# Patient Record
Sex: Female | Born: 1990
Health system: Southern US, Community
[De-identification: ages and names within clinical notes are randomized; demographics above are authoritative.]

## PROBLEM LIST (undated history)

## (undated) DIAGNOSIS — I10 Essential (primary) hypertension: Secondary | ICD-10-CM

---

## 2011-05-30 ENCOUNTER — Emergency Department (INDEPENDENT_AMBULATORY_CARE_PROVIDER_SITE_OTHER): Payer: PRIVATE HEALTH INSURANCE

## 2011-05-30 ENCOUNTER — Encounter (HOSPITAL_BASED_OUTPATIENT_CLINIC_OR_DEPARTMENT_OTHER): Payer: Self-pay | Admitting: Emergency Medicine

## 2011-05-30 ENCOUNTER — Emergency Department (HOSPITAL_BASED_OUTPATIENT_CLINIC_OR_DEPARTMENT_OTHER)
Admission: EM | Admit: 2011-05-30 | Discharge: 2011-05-30 | Disposition: A | Discharge status: Home / Self Care | Payer: PRIVATE HEALTH INSURANCE | Attending: Emergency Medicine | Admitting: Emergency Medicine

## 2011-05-30 DIAGNOSIS — R079 Chest pain, unspecified: Secondary | ICD-10-CM

## 2011-05-30 DIAGNOSIS — R0781 Pleurodynia: Secondary | ICD-10-CM

## 2011-05-30 DIAGNOSIS — R05 Cough: Secondary | ICD-10-CM

## 2011-05-30 DIAGNOSIS — R071 Chest pain on breathing: Secondary | ICD-10-CM | POA: Insufficient documentation

## 2011-05-30 MED ORDER — NAPROXEN 500 MG PO TABS: 500.0000 mg | ORAL_TABLET | Freq: Two times a day (BID) | ORAL | Status: AC

## 2011-05-30 NOTE — ED Notes (Signed)
Chest pain started  At 8pm  With deep breathing and cough.  Has had a cough 2 weeks

## 2011-05-30 NOTE — ED Provider Notes (Addendum)
History     CSN: [id number]  Arrival date & time 05/30/11  0240   First MD Initiated Contact with Patient 05/30/11 0303      Chief Complaint  Patient presents with  . Chest Pain    (Consider location/radiation/quality/duration/timing/severity/associated sxs/prior treatment) HPI Comments: 21 year old female with no significant past medical history presents with a complaint of a sharp chest pain that is present in the left chest and of which she only feels when she is taking a deep breath, coughing, laughing. This is mild, intermittent, not associated with fevers chills nausea vomiting abdominal pain back pain sore throat congestion swelling rash headache blurred vision or any other complaints. She has had a cough for the last 3 weeks which has been persistent over time, intermittent throughout the day and dry and nonproductive. She denies  diarrhea or dysuria. She has had no medications prior to arrival.  Patient denies travel, immobilization, surgery, trauma, hormone therapy, smoking. She denies any swelling or asymmetry of her legs or arms.  Patient is a 21 y.o. female presenting with chest pain. The history is provided by the patient and a friend.  Chest Pain     History reviewed. No pertinent past medical history.  History reviewed. No pertinent past surgical history.  No family history on file.  History  Substance Use Topics  . Smoking status: Never Smoker   . Smokeless tobacco: Not on file  . Alcohol Use: No    OB History    Grav Para Term Preterm Abortions TAB SAB Ect Mult Living                  Review of Systems  Cardiovascular: Positive for chest pain.  All other systems reviewed and are negative.    Allergies  Codeine  Home Medications   Current Outpatient Rx  Name Route Sig Dispense Refill  . NAPROXEN 500 MG PO TABS Oral Take 1 tablet (500 mg total) by mouth 2 (two) times daily with a meal. 30 tablet 0    BP 135/88  Pulse 88  Temp(Src) 98.1 F  (36.7 C) (Oral)  Resp 18  Ht 5\' 1"  (1.549 m)  Wt 138 lb (62.596 kg)  BMI 26.07 kg/m2  SpO2 100%  Physical Exam  Nursing note and vitals reviewed. Constitutional: She appears well-developed and well-nourished. No distress.  HENT:  Head: Normocephalic and atraumatic.  Mouth/Throat: Oropharynx is clear and moist. No oropharyngeal exudate.  Eyes: Conjunctivae and EOM are normal. Pupils are equal, round, and reactive to light. Right eye exhibits no discharge. Left eye exhibits no discharge. No scleral icterus.  Neck: Normal range of motion. Neck supple. No JVD present. No thyromegaly present.  Cardiovascular: Normal rate, regular rhythm, normal heart sounds and intact distal pulses.  Exam reveals no gallop and no friction rub.   No murmur heard. Pulmonary/Chest: Effort normal and breath sounds normal. No respiratory distress. She has no wheezes. She has no rales. She exhibits no tenderness.  Abdominal: Soft. Bowel sounds are normal. She exhibits no distension and no mass. There is no tenderness.  Musculoskeletal: Normal range of motion. She exhibits no edema and no tenderness.  Lymphadenopathy:    She has no cervical adenopathy.  Neurological: She is alert. Coordination normal.  Skin: Skin is warm and dry. No rash noted. No erythema.  Psychiatric: She has a normal mood and affect. Her behavior is normal.    ED Course  Procedures (including critical care time)  Labs Reviewed - No data to  display Dg Chest 2 View  05/30/2011  *RADIOLOGY REPORT*  Clinical Data: Lambert Mody left-sided chest pain and cough.  CHEST - 2 VIEW  Comparison: None.  Findings: The lungs are well-aerated and clear.  There is no evidence of focal opacification, pleural effusion or pneumothorax.  The heart is normal in size; the mediastinal contour is within normal limits.  No acute osseous abnormalities are seen.  IMPRESSION: No acute cardiopulmonary process seen.  Original Report Authenticated By: Tonia Ghent, M.D.      1. Pleuritic chest pain       MDM  Normal lung sounds, pulse of 88, sats are 100% on room air, blood pressure of 135/88, temperature 98.1. Patient has no risk factors for pulmonary embolism and given the fact that she has been having a dry cough for the last 3 weeks I suspect this is more related to a pleurisy or pleuritic chest pain. There is no asymmetry in her legs or other signs of pulmonary embolism. Check a chest x-ray to rule out an underlying pneumonia or small pneumothorax. ..  Recheck of patient after x-ray, no pain at rest, lung exam clear, chest x-ray according to my interpretation and the radiologist report shows no acute processes including pneumothorax or pneumonia.  Ms. Sedonia Small was offered intramuscular Toradol prior to discharge but declined requesting home medications with anti-inflammatory.  Medication List  As of 05/30/2011  3:28 AM   START taking these medications         naproxen 500 MG tablet   Commonly known as: NAPROSYN   Take 1 tablet (500 mg total) by mouth 2 (two) times daily with a meal.          Where to get your medications    These are the prescriptions that you need to pick up.   You may get these medications from any pharmacy.         naproxen 500 MG tablet               Vida Roller, MD 05/30/11 1610  Vida Roller, MD 05/30/11 [phone]

## 2012-05-01 ENCOUNTER — Emergency Department (HOSPITAL_BASED_OUTPATIENT_CLINIC_OR_DEPARTMENT_OTHER)
Admission: EM | Admit: 2012-05-01 | Discharge: 2012-05-01 | Disposition: A | Discharge status: Home / Self Care | Payer: Self-pay | Attending: Emergency Medicine | Admitting: Emergency Medicine

## 2012-05-01 ENCOUNTER — Emergency Department (HOSPITAL_BASED_OUTPATIENT_CLINIC_OR_DEPARTMENT_OTHER): Payer: Self-pay

## 2012-05-01 ENCOUNTER — Encounter (HOSPITAL_BASED_OUTPATIENT_CLINIC_OR_DEPARTMENT_OTHER): Payer: Self-pay | Admitting: *Deleted

## 2012-05-01 DIAGNOSIS — X500XXA Overexertion from strenuous movement or load, initial encounter: Secondary | ICD-10-CM | POA: Insufficient documentation

## 2012-05-01 DIAGNOSIS — S76019A Strain of muscle, fascia and tendon of unspecified hip, initial encounter: Secondary | ICD-10-CM

## 2012-05-01 DIAGNOSIS — Y92009 Unspecified place in unspecified non-institutional (private) residence as the place of occurrence of the external cause: Secondary | ICD-10-CM | POA: Insufficient documentation

## 2012-05-01 DIAGNOSIS — Z3202 Encounter for pregnancy test, result negative: Secondary | ICD-10-CM | POA: Insufficient documentation

## 2012-05-01 DIAGNOSIS — IMO0002 Reserved for concepts with insufficient information to code with codable children: Secondary | ICD-10-CM | POA: Insufficient documentation

## 2012-05-01 DIAGNOSIS — Y9389 Activity, other specified: Secondary | ICD-10-CM | POA: Insufficient documentation

## 2012-05-01 LAB — PREGNANCY, URINE: Preg Test, Ur: NEGATIVE

## 2012-05-01 MED ORDER — IBUPROFEN 800 MG PO TABS
800.0000 mg | ORAL_TABLET | Freq: Once | ORAL | Status: AC
  Administered 2012-05-01: 800 mg via ORAL
  Filled 2012-05-01: qty 1

## 2012-05-01 MED ORDER — NAPROXEN 375 MG PO TABS: 375.0000 mg | ORAL_TABLET | Freq: Two times a day (BID) | ORAL | Status: DC

## 2012-05-01 NOTE — ED Notes (Signed)
Pt reports full leg pain since 8pm- pain started while she was working this evening- denies known injury- denies recent travel

## 2012-05-01 NOTE — ED Notes (Signed)
Pt asked for urine sample. Sts she doesn't need to go at this time but will try.

## 2012-05-01 NOTE — ED Provider Notes (Signed)
History     CSN: [id number]  Arrival date & time 05/01/12  0044   First MD Initiated Contact with Patient 05/01/12 0300      Chief Complaint  Patient presents with  . Leg Pain    (Consider location/radiation/quality/duration/timing/severity/associated sxs/prior treatment) Patient is a 21 y.o. female presenting with leg pain. The history is provided by the patient.  Leg Pain  The incident occurred 1 to 2 hours ago. The incident occurred at home. Injury mechanism: laying on right hip. The pain is present in the right hip. The quality of the pain is described as aching. The pain is severe. The pain has been constant since onset. Pertinent negatives include no numbness, no inability to bear weight, no loss of motion, no muscle weakness, no loss of sensation and no tingling. She reports no foreign bodies present. The symptoms are aggravated by activity and palpation. She has tried nothing for the symptoms. The treatment provided no relief.    History reviewed. No pertinent past medical history.  History reviewed. No pertinent past surgical history.  No family history on file.  History  Substance Use Topics  . Smoking status: Never Smoker   . Smokeless tobacco: Never Used  . Alcohol Use: No    OB History    Grav Para Term Preterm Abortions TAB SAB Ect Mult Living                  Review of Systems  Cardiovascular: Negative for palpitations and leg swelling.  Neurological: Negative for tingling and numbness.  All other systems reviewed and are negative.    Allergies  Codeine  Home Medications   Current Outpatient Rx  Name  Route  Sig  Dispense  Refill  . NAPROXEN 500 MG PO TABS   Oral   Take 1 tablet (500 mg total) by mouth 2 (two) times daily with a meal.   30 tablet   0     BP 120/73  Pulse 84  Temp 97.9 F (36.6 C) (Oral)  Resp 20  Ht 5\' 2"  (1.575 m)  Wt 128 lb (58.06 kg)  BMI 23.41 kg/m2  SpO2 100%  LMP 04/18/2012  Physical Exam  Constitutional:  She is oriented to person, place, and time. She appears well-developed and well-nourished. No distress.  HENT:  Head: Normocephalic and atraumatic.  Mouth/Throat: Oropharynx is clear and moist.  Eyes: Conjunctivae normal are normal. Pupils are equal, round, and reactive to light.  Neck: Normal range of motion. Neck supple.  Cardiovascular: Normal rate and regular rhythm.   Pulmonary/Chest: Effort normal and breath sounds normal. She has no wheezes. She has no rales.  Abdominal: Soft. Bowel sounds are normal. There is no tenderness. There is no rebound and no guarding.  Musculoskeletal: Normal range of motion. She exhibits no edema and no tenderness.  Neurological: She is alert and oriented to person, place, and time. She has normal reflexes.  Skin: Skin is warm and dry.  Psychiatric: She has a normal mood and affect.    ED Course  Procedures (including critical care time)   Labs Reviewed  PREGNANCY, URINE   No results found.   No diagnosis found.    MDM  Return for weakness numbness or swelling       Trevell Pariseau K Adoni Greenough-Rasch, MD 05/01/12 4098

## 2012-07-20 ENCOUNTER — Encounter (HOSPITAL_BASED_OUTPATIENT_CLINIC_OR_DEPARTMENT_OTHER): Payer: Self-pay | Admitting: *Deleted

## 2012-07-20 ENCOUNTER — Emergency Department (HOSPITAL_BASED_OUTPATIENT_CLINIC_OR_DEPARTMENT_OTHER)
Admission: EM | Admit: 2012-07-20 | Discharge: 2012-07-21 | Disposition: A | Discharge status: Home / Self Care | Payer: Self-pay | Attending: Emergency Medicine | Admitting: Emergency Medicine

## 2012-07-20 DIAGNOSIS — A084 Viral intestinal infection, unspecified: Secondary | ICD-10-CM

## 2012-07-20 DIAGNOSIS — R197 Diarrhea, unspecified: Secondary | ICD-10-CM | POA: Insufficient documentation

## 2012-07-20 DIAGNOSIS — Z3202 Encounter for pregnancy test, result negative: Secondary | ICD-10-CM | POA: Insufficient documentation

## 2012-07-20 DIAGNOSIS — A088 Other specified intestinal infections: Secondary | ICD-10-CM | POA: Insufficient documentation

## 2012-07-20 LAB — URINALYSIS, ROUTINE W REFLEX MICROSCOPIC
Bilirubin Urine: NEGATIVE
Ketones, ur: >80 mg/dL — AB
Nitrite: NEGATIVE
Urobilinogen, UA: 1 mg/dL (ref 0.0–1.0)

## 2012-07-20 MED ORDER — DICYCLOMINE HCL 10 MG PO CAPS: 10.0000 mg | ORAL_CAPSULE | Freq: Once | ORAL | Status: DC

## 2012-07-20 MED ORDER — DICYCLOMINE HCL 10 MG PO CAPS
10.0000 mg | ORAL_CAPSULE | Freq: Once | ORAL | Status: AC
  Administered 2012-07-20: 10 mg via ORAL
  Filled 2012-07-20: qty 1

## 2012-07-20 MED ORDER — DICYCLOMINE HCL 10 MG/ML IM SOLN
20.0000 mg | Freq: Once | INTRAMUSCULAR | Status: AC
  Administered 2012-07-20: 20 mg via INTRAMUSCULAR
  Filled 2012-07-20: qty 2

## 2012-07-20 MED ORDER — ONDANSETRON HCL 4 MG/2ML IJ SOLN
4.0000 mg | Freq: Once | INTRAMUSCULAR | Status: AC
  Administered 2012-07-20: 4 mg via INTRAVENOUS
  Filled 2012-07-20: qty 2

## 2012-07-20 MED ORDER — PROMETHAZINE HCL 25 MG PO TABS: 25.0000 mg | ORAL_TABLET | Freq: Four times a day (QID) | ORAL | Status: DC | PRN

## 2012-07-20 MED ORDER — SODIUM CHLORIDE 0.9 % IV BOLUS (SEPSIS)
1000.0000 mL | Freq: Once | INTRAVENOUS | Status: AC
  Administered 2012-07-20: 1000 mL via INTRAVENOUS

## 2012-07-20 NOTE — ED Provider Notes (Signed)
History     CSN: [id number]  Arrival date & time 07/20/12  2008   First MD Initiated Contact with Patient 07/20/12 2135      Chief Complaint  Patient presents with  . Emesis    (Consider location/radiation/quality/duration/timing/severity/associated sxs/prior treatment) HPI Comments: Patient is a 22 year old female who presents with vomiting and diarrhea since this morning. Patient reports gradual onset and progressive worsening of symptoms since the onset. Patient has not tried anything for symptoms. No aggravating/alleviating factors. No associated symptoms. No known sick contacts.    History reviewed. No pertinent past medical history.  History reviewed. No pertinent past surgical history.  No family history on file.  History  Substance Use Topics  . Smoking status: Never Smoker   . Smokeless tobacco: Never Used  . Alcohol Use: No    OB History   Grav Para Term Preterm Abortions TAB SAB Ect Mult Living                  Review of Systems  Gastrointestinal: Positive for vomiting and diarrhea.  All other systems reviewed and are negative.    Allergies  Codeine  Home Medications   Current Outpatient Rx  Name  Route  Sig  Dispense  Refill  . naproxen (NAPROSYN) 375 MG tablet   Oral   Take 1 tablet (375 mg total) by mouth 2 (two) times daily.   20 tablet   0     BP 114/79  Pulse 75  Temp(Src) 97.8 F (36.6 C) (Oral)  Resp 20  Wt 127 lb (57.607 kg)  BMI 23.22 kg/m2  SpO2 98%  LMP 06/26/2012  Physical Exam  Nursing note and vitals reviewed. Constitutional: She is oriented to person, place, and time. She appears well-developed and well-nourished. No distress.  HENT:  Head: Normocephalic and atraumatic.  Eyes: Conjunctivae are normal.  Neck: Normal range of motion.  Cardiovascular: Normal rate and regular rhythm.  Exam reveals no gallop and no friction rub.   No murmur heard. Pulmonary/Chest: Effort normal and breath sounds normal. She has no  wheezes. She has no rales. She exhibits no tenderness.  Abdominal: Soft. There is no tenderness.  Musculoskeletal: Normal range of motion.  Neurological: She is alert and oriented to person, place, and time.  Speech is goal-oriented. Moves limbs without ataxia.   Skin: Skin is warm and dry.  Psychiatric: She has a normal mood and affect. Her behavior is normal.    ED Course  Procedures (including critical care time)  Labs Reviewed  URINALYSIS, ROUTINE W REFLEX MICROSCOPIC - Abnormal; Notable for the following:    Specific Gravity, Urine 1.035 (*)    Ketones, ur >80 (*)    All other components within normal limits  PREGNANCY, URINE   No results found.   1. Viral gastroenteritis       MDM  11:13 PM Will have IV fluids and zofran for nausea.   11:45 PM Patient likely has viral gastroenteritis. Patient feeling some relief. I will discharge her with Zofran and bentyl for symptoms. Patient is afebrile with stable vitals. No further evaluation needed at this time.       Emilia Beck, PA-C 07/24/12 1300

## 2012-07-20 NOTE — ED Notes (Signed)
Vomiting and diarrhea since this am

## 2012-07-20 NOTE — Discharge Instructions (Signed)
Take Bentyl as needed for abdominal pain. Take phenergan as needed for nausea. Refer to attached documents for more information regarding your diagnosis. Return to the ED with worsening or concerning symptoms.

## 2012-07-21 ENCOUNTER — Telehealth (HOSPITAL_COMMUNITY): Payer: Self-pay | Admitting: Emergency Medicine

## 2012-07-21 NOTE — ED Notes (Signed)
Pharmacy called for clarification of Bentyl Rx.  Call transferred to prescriber Ball PA.

## 2012-07-22 NOTE — ED Notes (Signed)
Pt presented to ED today with photo ID requesting return to work note for school and work. Note provided.

## 2012-07-24 NOTE — ED Provider Notes (Signed)
Medical screening examination/treatment/procedure(s) were performed by non-physician practitioner and as supervising physician I was immediately available for consultation/collaboration.   Toshiyuki Fredell, MD 07/24/12 2105 

## 2013-02-05 ENCOUNTER — Emergency Department (HOSPITAL_BASED_OUTPATIENT_CLINIC_OR_DEPARTMENT_OTHER)
Admission: EM | Admit: 2013-02-05 | Discharge: 2013-02-05 | Disposition: A | Discharge status: Home / Self Care | Payer: 59 | Attending: Emergency Medicine | Admitting: Emergency Medicine

## 2013-02-05 ENCOUNTER — Encounter (HOSPITAL_BASED_OUTPATIENT_CLINIC_OR_DEPARTMENT_OTHER): Payer: Self-pay

## 2013-02-05 DIAGNOSIS — G43909 Migraine, unspecified, not intractable, without status migrainosus: Secondary | ICD-10-CM

## 2013-02-05 MED ORDER — SUMATRIPTAN SUCCINATE 6 MG/0.5ML ~~LOC~~ SOLN
6.0000 mg | Freq: Once | SUBCUTANEOUS | Status: AC
  Administered 2013-02-05: 6 mg via SUBCUTANEOUS
  Filled 2013-02-05: qty 0.5

## 2013-02-05 MED ORDER — ONDANSETRON 8 MG PO TBDP
8.0000 mg | ORAL_TABLET | Freq: Once | ORAL | Status: AC
  Administered 2013-02-05: 8 mg via ORAL
  Filled 2013-02-05: qty 1

## 2013-02-05 MED ORDER — OXYCODONE-ACETAMINOPHEN 5-325 MG PO TABS: 1.0000 | ORAL_TABLET | ORAL | Status: DC | PRN

## 2013-02-05 MED ORDER — ONDANSETRON 8 MG PO TBDP: 8.0000 mg | ORAL_TABLET | Freq: Three times a day (TID) | ORAL | Status: DC | PRN

## 2013-02-05 NOTE — ED Notes (Signed)
After admin of Imitrex subq, pt reported increase in nausea and flush feeling. Pt educated that those were common side effects of imitrex and we would watch her for a few minutes until they subside.

## 2013-02-05 NOTE — ED Provider Notes (Signed)
CSN: [id number]     Arrival date & time 02/05/13  2158 History  This chart was scribed for Hilario Quarry, MD by Ronal Fear, ED Scribe. This patient was seen in room MH02/MH02 and the patient's care was started at 10:46 PM.     Chief Complaint  Patient presents with  . Headache   Patient is a 22 y.o. female presenting with headaches. The history is provided by the patient. No language interpreter was used.  Headache Pain location:  Generalized Quality:  Sharp Radiates to:  Does not radiate Severity currently:  10/10 Severity at highest:  10/10 Onset quality:  Gradual Duration:  5 hours Timing:  Constant Progression:  Improving Chronicity:  Recurrent Similar to prior headaches: yes   Context: activity   Relieved by:  None tried Ineffective treatments:  None tried Associated symptoms: nausea   Associated symptoms: no blurred vision, no dizziness, no near-syncope, no neck pain, no neck stiffness, no photophobia, no sore throat, no syncope, no visual change and no vomiting    HPI Comments: Presleigh Feldstein is a 22 y.o. female with a hx of headaches who presents to the Emergency Department complaining of gradual onset HA at 10/10 onset 5x hours, and 2 hours ago she felt nauseous. Pt states that the HA's may be associated with her periods when asked. Pt has had similar HA's before, but not for this amount of time. Pt has not taken any medication for the HA's. Denies Fever chills, neck stiffness. She does not appear to be in any acute distress with no other complaints. History reviewed. No pertinent past medical history. History reviewed. No pertinent past surgical history. No family history on file. History  Substance Use Topics  . Smoking status: Never Smoker   . Smokeless tobacco: Never Used  . Alcohol Use: No   OB History   Grav Para Term Preterm Abortions TAB SAB Ect Mult Living                 Review of Systems  HENT: Negative for sore throat, neck pain and neck stiffness.    Eyes: Negative for blurred vision and photophobia.  Cardiovascular: Negative for syncope and near-syncope.  Gastrointestinal: Positive for nausea. Negative for vomiting.  Neurological: Positive for headaches. Negative for dizziness.  All other systems reviewed and are negative.    Allergies  Codeine  Home Medications   Current Outpatient Rx  Name  Route  Sig  Dispense  Refill  . dicyclomine (BENTYL) 10 MG capsule   Oral   Take 1 capsule (10 mg total) by mouth once.   10 capsule   0   . naproxen (NAPROSYN) 375 MG tablet   Oral   Take 1 tablet (375 mg total) by mouth 2 (two) times daily.   20 tablet   0   . promethazine (PHENERGAN) 25 MG tablet   Oral   Take 1 tablet (25 mg total) by mouth every 6 (six) hours as needed for nausea.   12 tablet   0    BP 128/90  Pulse 76  Temp(Src) 97.9 F (36.6 C) (Oral)  Resp 16  Ht 5\' 2"  (1.575 m)  Wt 135 lb (61.236 kg)  BMI 24.69 kg/m2  SpO2 100%  LMP 01/29/2013 Physical Exam  Nursing note and vitals reviewed. Constitutional: She is oriented to person, place, and time. She appears well-developed and well-nourished. No distress.  HENT:  Head: Normocephalic and atraumatic.  Eyes: EOM are normal.  Neck: Neck supple. No  tracheal deviation present.  Cardiovascular: Normal rate.   Pulmonary/Chest: Effort normal. No respiratory distress.  Musculoskeletal: Normal range of motion.  Neurological: She is alert and oriented to person, place, and time. No cranial nerve deficit.  Skin: Skin is warm and dry.  Psychiatric: She has a normal mood and affect. Her behavior is normal.    ED Course  Procedures (including critical care time)  DIAGNOSTIC STUDIES: Oxygen Saturation is 100% on RA, normal by my interpretation.    COORDINATION OF CARE: 11:00 PM- Pt advised of plan for treatment and pt agrees.     Labs Review Labs Reviewed - No data to display Imaging Review No results found.  MDM  No diagnosis found. Pt HA treated  and improved while in ED.  Presentation is like pts typical HA and non concerning for [facility], ICH, Meningitis, or temporal arteritis. Pt is afebrile with no focal neuro deficits, nuchal rigidity, or change in vision. Pt is to follow up with PCP to discuss prophylactic medication. Pt verbalizes understanding and is agreeable with plan to dc.  I personally performed the services described in this documentation, which was scribed in my presence. The recorded information has been reviewed and considered.    Hilario Quarry, MD 02/05/13 [phone]

## 2013-02-05 NOTE — ED Notes (Addendum)
HA x 5 hours-nausea x 2 hours-no pain meds PTA

## 2014-12-17 ENCOUNTER — Emergency Department
Admission: EM | Admit: 2014-12-17 | Discharge: 2014-12-17 | Disposition: A | Discharge status: Home / Self Care | Payer: Self-pay | Source: Home / Self Care | Attending: Family Medicine | Admitting: Family Medicine

## 2014-12-17 ENCOUNTER — Encounter: Payer: Self-pay | Admitting: *Deleted

## 2014-12-17 DIAGNOSIS — L089 Local infection of the skin and subcutaneous tissue, unspecified: Secondary | ICD-10-CM

## 2014-12-17 DIAGNOSIS — T148 Other injury of unspecified body region: Secondary | ICD-10-CM

## 2014-12-17 DIAGNOSIS — W57XXXA Bitten or stung by nonvenomous insect and other nonvenomous arthropods, initial encounter: Secondary | ICD-10-CM

## 2014-12-17 MED ORDER — MUPIROCIN 2 % EX OINT: TOPICAL_OINTMENT | CUTANEOUS | Status: DC

## 2014-12-17 MED ORDER — CEPHALEXIN 500 MG PO CAPS: 500.0000 mg | ORAL_CAPSULE | Freq: Two times a day (BID) | ORAL | Status: DC

## 2014-12-17 NOTE — ED Notes (Signed)
Pt reports insect bite to right ankle 1 month ago that was treated with Keflex. She only took 4 days worth, site resolved but seems to have returned with drainage, pain and swelling.

## 2014-12-17 NOTE — Discharge Instructions (Signed)
°  Please take antibiotics as prescribed and be sure to complete entire course even if you start to feel better to ensure infection does not come back. ° °

## 2014-12-17 NOTE — ED Provider Notes (Signed)
CSN: [id number]     Arrival date & time 12/17/14  1212 History   First MD Initiated Contact with Patient 12/17/14 1232     Chief Complaint  Patient presents with  . Insect Bite   (Consider location/radiation/quality/duration/timing/severity/associated sxs/prior Treatment) HPI The pt is a 24yo female presenting to [facility] with c/o recurrent infected insect bite. Pt states she was seen about 1 month ago for an infected insect bite and was placed on Keflex. Pt took 4 days of the 7 day course as she states symptoms seemed to have resolved, however, within the last 2 days symptoms have returned. Pt reports mild to moderate itching, redness, soreness and swelling to the medial side of her Right ankle where initial insect bite was.  Denies fever, chills, n/v/d. No sick contacts, recent travel or known tick bites.  History reviewed. No pertinent past medical history. History reviewed. No pertinent past surgical history. History reviewed. No pertinent family history. Social History  Substance Use Topics  . Smoking status: Never Smoker   . Smokeless tobacco: Never Used  . Alcohol Use: No   OB History    No data available     Review of Systems  Constitutional: Negative for fever and chills.  Respiratory: Negative for shortness of breath and wheezing.   Gastrointestinal: Negative for nausea, vomiting and diarrhea.  Musculoskeletal: Positive for myalgias, joint swelling and arthralgias.       Right ankle  Skin: Positive for rash and wound. Negative for color change.       Right ankle: infected insect bite    Allergies  Codeine  Home Medications   Prior to Admission medications   Medication Sig Start Date End Date Taking? Authorizing Provider  Prenatal MV-Min-Fe Fum-FA-DHA (PRENATAL 1 PO) Take by mouth.   Yes Historical Provider, MD  cephALEXin (KEFLEX) 500 MG capsule Take 1 capsule (500 mg total) by mouth 2 (two) times daily. For 7 days 12/17/14   Junius Finner, PA-C  mupirocin ointment  (BACTROBAN) 2 % Apply to area 3 times daily for 7 days 12/17/14   Junius Finner, PA-C   BP 121/80 mmHg  Pulse 87  Temp(Src) 98.4 F (36.9 C) (Oral)  Resp 16  Ht  (1.575 m)  Wt 156 lb (70.761 kg)  BMI 28.53 kg/m2  SpO2 99%  LMP 11/25/2014 Physical Exam  Constitutional: She is oriented to person, place, and time. She appears well-developed and well-nourished.  HENT:  Head: Normocephalic and atraumatic.  Eyes: EOM are normal.  Neck: Normal range of motion.  Cardiovascular: Normal rate.   Pulses:      Dorsalis pedis pulses are 2+ on the right side.  Pulmonary/Chest: Effort normal.  Musculoskeletal: Normal range of motion. She exhibits edema and tenderness.  Right ankle, medial aspect: mild edema and tenderness. FROM (see skin exam)  Neurological: She is alert and oriented to person, place, and time.  Skin: Skin is warm and dry. Rash noted. There is erythema.  Right ankle, medial aspect: pinpoint opening in skin with scant clear discharge, 1cm area of surrounding erythema and edema with mild tenderness. No bleeding. No induration or fluctuance. No red streaking. No foreign bodies seen or palpated.  Psychiatric: She has a normal mood and affect. Her behavior is normal.  Nursing note and vitals reviewed.   ED Course  Procedures (including critical care time) Labs Review Labs Reviewed - No data to display  Imaging Review No results found.   MDM   1. Infected insect bite  Pt c/o insect bite becoming infected after not completing full course of keflex from 1 month ago. No systemic symptoms. No evidence of underlying abscess on exam.  Mild cellulitis of skin over Right medial ankle. Rx: keflex and mupirocin for 7 days. Strongly encouraged to keep clean with soap and water, may cover with bandage to keep clean and protected.  Advised to complete entire course of antibiotics even if she starts to feel better to help prevent infection from returning. F/u with PCP in 1 week if  not improving, sooner if worsening. Patient verbalized understanding and agreement with treatment plan.    Junius Finner, PA-C 12/17/14 1332

## 2015-02-13 ENCOUNTER — Ambulatory Visit (INDEPENDENT_AMBULATORY_CARE_PROVIDER_SITE_OTHER): Payer: Self-pay | Admitting: Osteopathic Medicine

## 2015-02-13 ENCOUNTER — Encounter: Payer: Self-pay | Admitting: Osteopathic Medicine

## 2015-02-13 VITALS — BP 105/73 | HR 95 | Temp 98.7°F | Ht 62.0 in | Wt 158.0 lb

## 2015-02-13 DIAGNOSIS — S80861A Insect bite (nonvenomous), right lower leg, initial encounter: Secondary | ICD-10-CM

## 2015-02-13 DIAGNOSIS — W57XXXA Bitten or stung by nonvenomous insect and other nonvenomous arthropods, initial encounter: Principal | ICD-10-CM

## 2015-02-13 DIAGNOSIS — Z331 Pregnant state, incidental: Secondary | ICD-10-CM

## 2015-02-13 DIAGNOSIS — L500 Allergic urticaria: Secondary | ICD-10-CM

## 2015-02-13 DIAGNOSIS — Z3490 Encounter for supervision of normal pregnancy, unspecified, unspecified trimester: Secondary | ICD-10-CM

## 2015-02-13 MED ORDER — TRIAMCINOLONE ACETONIDE 0.5 % EX CREA: 1.0000 "application " | TOPICAL_CREAM | Freq: Two times a day (BID) | CUTANEOUS | Status: DC

## 2015-02-13 MED ORDER — HYDROXYZINE HCL 25 MG PO TABS: 25.0000 mg | ORAL_TABLET | Freq: Three times a day (TID) | ORAL | Status: DC | PRN

## 2015-02-13 MED ORDER — PREDNISONE 10 MG (48) PO TBPK: ORAL_TABLET | Freq: Every day | ORAL | Status: DC

## 2015-02-13 NOTE — Patient Instructions (Signed)
Fill prescription for steroids if your itching isn't helped by the other medications. If you start taking the prednisone, stop the other medications (triamcinolone cream and hydroxyzine). If you notice increased redness, drainage, pain, fever/chills, or other concerns let Dr Lyn Hollingshead know right away and come back to the office to be evaluated for possible infection.

## 2015-02-13 NOTE — Progress Notes (Signed)
HPI: Christine West is a 24 y.o. female who presents to [facility]  today for chief complaint of:  Chief Complaint  Patient presents with  . Establish Care    bug bite on back of thigh    . Location: Right posterior thigh . Quality: Itching, swelling, nonpainful . Severity: marked . Duration: 1 day . Context: thinks bug bite in her sleep  . Modifying factors: tried cortisone cream . Assoc signs/symptoms: no fever/chills   Past medical, social and family history reviewed: History reviewed. No pertinent past medical history. History reviewed. No pertinent past surgical history. Social History  Substance Use Topics  . Smoking status: Never Smoker   . Smokeless tobacco: Never Used  . Alcohol Use: No   Family History  Problem Relation Age of Onset  . Diabetes Maternal Grandfather   . Hypertension Paternal Grandfather     Current Outpatient Prescriptions  Medication Sig Dispense Refill  .      .      . Prenatal MV-Min-Fe Fum-FA-DHA (PRENATAL 1 PO) Take by mouth.     No current facility-administered medications for this visit.   Allergies  Allergen Reactions  . Codeine Nausea And Vomiting      Review of Systems: CONSTITUTIONAL: Neg fever/chills, no unintentional weight changes HEAD/EYES/EARS/NOSE/THROAT: No headache/vision change or hearing change, no sore throat CARDIAC: No chest pain/pressure/palpitations, no orthopnea RESPIRATORY: No cough/shortness of breath/wheeze GASTROINTESTINAL: No nausea/vomiting/abdominal pain/blood in stool/diarrhea/constipation MUSCULOSKELETAL: No myalgia/arthralgia GENITOURINARY: No incontinence, No abnormal genital bleeding/discharge SKIN: (+) rash/wounds/concerning lesions as per HPI HEM/ONC: No easy bruising/bleeding, no abnormal lymph node ENDOCRINE: No polyuria/polydipsia/polyphagia, no heat/cold intolerance  NEUROLOGIC: No weakness/dizzines/slurred speech PSYCHIATRIC: No concerns with  depression/anxiety or sleep problems    Exam:  There were no vitals taken for this visit. Constitutional: VSS, see above. General Appearance: alert, well-developed, well-nourished, NAD Eyes: Normal lids and conjunctive, non-icteric sclera, d Respiratory: Normal respiratory effort. no wheeze/rhonchi/rales Cardiovascular: S1/S2 normal, no murmur/rub/gallop auscultated. RRR Psychiatric: Normal judgment/insight. Normal mood and affect. Oriented x3.  Skin: Approximately 10 cm area of urticarial reaction L posterior thigh, minimal erythema, no drainage, positive edema, nonpitting, no ulceration.    No results found for this or any previous visit (from the past 72 hour(s)).   ASSESSMENT/PLAN:  Insect bite of right leg, initial encounter  Allergic urticaria - Plan: hydrOXYzine (ATARAX/VISTARIL) 25 MG tablet, triamcinolone cream (KENALOG) 0.5 %, predniSONE (STERAPRED UNI-PAK 48 TAB) 10 MG (48) TBPK tablet  Pregnancy - Following with OB, first prenatal visit is tomorrow.    Prescriptions for Atarax and triamcinolone were sent to pharmacy for patient, advised to try these first and if not helping then stop these and still prednisone. Advised most likely allergic reaction, very low suspicion for cellulitis so she is advised on percussion to return to clinic, including increased pain/redness, drainage, fever chills, rash traveling up the leg, any other concerns. We'll treat as allergic urticaria, low suspicion of cellulitis at this time.

## 2016-01-17 ENCOUNTER — Emergency Department
Admission: EM | Admit: 2016-01-17 | Discharge: 2016-01-17 | Disposition: A | Discharge status: Home / Self Care | Payer: Self-pay | Source: Home / Self Care | Attending: Family Medicine | Admitting: Family Medicine

## 2016-01-17 ENCOUNTER — Encounter: Payer: Self-pay | Admitting: Emergency Medicine

## 2016-01-17 DIAGNOSIS — N898 Other specified noninflammatory disorders of vagina: Secondary | ICD-10-CM

## 2016-01-17 DIAGNOSIS — B3731 Acute candidiasis of vulva and vagina: Secondary | ICD-10-CM

## 2016-01-17 DIAGNOSIS — B373 Candidiasis of vulva and vagina: Secondary | ICD-10-CM

## 2016-01-17 MED ORDER — TERCONAZOLE 0.8 % VA CREA: 1.0000 | TOPICAL_CREAM | Freq: Every day | VAGINAL | 0 refills | Status: DC

## 2016-01-17 NOTE — ED Triage Notes (Signed)
Pt c/o vaginal itching, burning, and yellow d/c. Denies odor or pelvic pain. x1 week.

## 2016-01-17 NOTE — ED Notes (Signed)
Password  [email or web address]

## 2016-01-18 ENCOUNTER — Telehealth: Payer: Self-pay

## 2016-01-18 ENCOUNTER — Telehealth: Payer: Self-pay | Admitting: Emergency Medicine

## 2016-01-18 LAB — WET PREP BY MOLECULAR PROBE
Candida species: NEGATIVE
Gardnerella vaginalis: POSITIVE — AB
Trichomonas vaginosis: NEGATIVE

## 2016-01-18 LAB — GC/CHLAMYDIA PROBE AMP
CT Probe RNA: NOT DETECTED
GC Probe RNA: NOT DETECTED

## 2016-01-18 MED ORDER — METRONIDAZOLE 500 MG PO TABS: 500.0000 mg | ORAL_TABLET | Freq: Two times a day (BID) | ORAL | 0 refills | Status: DC

## 2016-01-18 NOTE — ED Provider Notes (Signed)
CSN: [id number]     Arrival date & time 01/17/16  1719 History   First MD Initiated Contact with Patient 01/17/16 1757     Chief Complaint  Patient presents with  . Vaginal Itching   (Consider location/radiation/quality/duration/timing/severity/associated sxs/prior Treatment) HPI  Christine West is a 25 y.o. female presenting to UC with c/o mild to moderate vaginal itching, burning, and yellow discharge that started 1 week ago. She has not tried any OTC treatments. Denies odor, pelvic pain, n/v/d, fever or chills. Denies urinary symptoms.  Pt states she is married but would still like to be tested for gonorrhea and chlamydia.  Pt notes she believes she has a yeast infection, and is requesting terconazole as the one time diflucan does not work well for her.    History reviewed. No pertinent past medical history. History reviewed. No pertinent surgical history. Family History  Problem Relation Age of Onset  . Diabetes Maternal Grandfather   . Hypertension Paternal Grandfather    Social History  Substance Use Topics  . Smoking status: Never Smoker  . Smokeless tobacco: Never Used  . Alcohol use No   OB History    Gravida Para Term Preterm AB Living   1             SAB TAB Ectopic Multiple Live Births                 Review of Systems  Constitutional: Negative for chills and fever.  Gastrointestinal: Negative for abdominal pain, nausea and vomiting.  Genitourinary: Positive for vaginal discharge and vaginal pain (itching and burning). Negative for decreased urine volume, dysuria, flank pain, frequency, genital sores, hematuria and urgency.  Musculoskeletal: Negative for back pain and myalgias.    Allergies  Codeine  Home Medications   Prior to Admission medications   Medication Sig Start Date End Date Taking? Authorizing Provider  hydrOXYzine (ATARAX/VISTARIL) 25 MG tablet Take 1 tablet (25 mg total) by mouth 3 (three) times daily as needed. 02/13/15   Sunnie NielsenNatalie Alexander,  DO  metroNIDAZOLE (FLAGYL) 500 MG tablet Take 1 tablet (500 mg total) by mouth 2 (two) times daily. 01/18/16   Junius FinnerErin O'Malley, PA-C  predniSONE (STERAPRED UNI-PAK 48 TAB) 10 MG (48) TBPK tablet Take by mouth daily. 12-Day taper, po 02/13/15   Sunnie NielsenNatalie Alexander, DO  Prenatal MV-Min-Fe Fum-FA-DHA (PRENATAL 1 PO) Take by mouth.    Historical Provider, MD  terconazole (TERAZOL 3) 0.8 % vaginal cream Place 1 applicator vaginally at bedtime. For 3 days 01/17/16   Junius FinnerErin O'Malley, PA-C  triamcinolone cream (KENALOG) 0.5 % Apply 1 application topically 2 (two) times daily. To affected areas. 02/13/15   Sunnie NielsenNatalie Alexander, DO   Meds Ordered and Administered this Visit  Medications - No data to display  BP 128/89 (BP Location: Right Arm)   Pulse 68   Temp 98.2 F (36.8 C) (Oral)   Wt 148 lb (67.1 kg)   LMP  (Within Weeks)   SpO2 98%   Breastfeeding? No   BMI 27.07 kg/m  No data found.   Physical Exam  Constitutional: She is oriented to person, place, and time. She appears well-developed and well-nourished.  HENT:  Head: Normocephalic and atraumatic.  Eyes: EOM are normal.  Neck: Normal range of motion.  Cardiovascular: Normal rate and regular rhythm.   Pulmonary/Chest: Effort normal and breath sounds normal. No respiratory distress. She has no wheezes. She has no rales.  Abdominal: Soft. She exhibits no distension and no mass. There is  no tenderness. There is no rebound, no guarding and no CVA tenderness.  Genitourinary: Vaginal discharge found.  Genitourinary Comments: Chaperoned exam: small amount of thin to thick white-yellow discharge. No bleeding. No CMT, adnexal tenderness or masses.   Musculoskeletal: Normal range of motion.  Neurological: She is alert and oriented to person, place, and time.  Skin: Skin is warm and dry.  Psychiatric: She has a normal mood and affect. Her behavior is normal.  Nursing note and vitals reviewed.   Urgent Care Course   Clinical Course    Procedures  (including critical care time)  Labs Review Labs Reviewed  WET PREP BY MOLECULAR PROBE - Abnormal; Notable for the following:       Result Value   Gardnerella vaginalis POS (*)    All other components within normal limits   Narrative:    Performed at:  [facility]                [address], [address]                [place], [place] [address]  GC/CHLAMYDIA PROBE AMP   Narrative:    Performed at:  [facility]                [address], [address]                [place], [place] [address]    Imaging Review No results found.    MDM   1. Vaginal yeast infection   2. Vaginal discharge    Pt c/o vaginal discharge. Concern for yeast infection.  Exam c/w yeast infection with thick white discharge. Pt also requested to be checked for GC/chlamydia.  Wet prep: Pending.  Rx: terconazole Encouraged f/u with PCP or OB/GYN as needed. Patient verbalized understanding and agreement with treatment plan.    Junius Finner, PA-C 01/18/16 [phone]

## 2016-01-18 NOTE — Telephone Encounter (Signed)
Pt called with further questions regarding VB.  Questions were answered, and told to follow up if condition worsens.

## 2016-01-18 NOTE — Telephone Encounter (Signed)
Patient is positive for BV, Christine FinnerErin O'Malley e-scribed Flagyl, I called patient gave her the lab results advised her not to frink alcohol with this medication. She acknowledged understanding.

## 2016-01-18 NOTE — Telephone Encounter (Signed)
Wet prep came back POSITIVE for bacterial vaginosis and NEGATIVE for yeast infection.  Flagyl has been sent to pt's pharmacy. Pt advised she may stop taking yeast infection medication. Encouraged to start taking flagyl.

## 2016-05-29 ENCOUNTER — Emergency Department
Admission: EM | Admit: 2016-05-29 | Discharge: 2016-05-29 | Disposition: A | Discharge status: Home / Self Care | Payer: Self-pay | Source: Home / Self Care | Attending: Family Medicine | Admitting: Family Medicine

## 2016-05-29 DIAGNOSIS — J111 Influenza due to unidentified influenza virus with other respiratory manifestations: Secondary | ICD-10-CM

## 2016-05-29 DIAGNOSIS — R69 Illness, unspecified: Secondary | ICD-10-CM

## 2016-05-29 MED ORDER — BENZONATATE 200 MG PO CAPS: ORAL_CAPSULE | ORAL | 0 refills | Status: DC

## 2016-05-29 MED ORDER — OSELTAMIVIR PHOSPHATE 75 MG PO CAPS: 75.0000 mg | ORAL_CAPSULE | Freq: Two times a day (BID) | ORAL | 0 refills | Status: DC

## 2016-05-29 NOTE — Discharge Instructions (Signed)
Take plain guaifenesin (1200mg  extended release tabs such as Mucinex) twice daily, with plenty of water, for cough and congestion.  May add Pseudoephedrine (30mg , one or two every 4 to 6 hours) for sinus congestion.  Get adequate rest.   Try warm salt water gargles for sore throat.  Stop all antihistamines for now, and other non-prescription cough/cold preparations. May take Ibuprofen 200mg , 4 tabs every 8 hours with food for body aches, fever, etc.

## 2016-05-29 NOTE — ED Provider Notes (Signed)
[facility]    CSN: [id number] Arrival date & time: 05/29/16  1635     History   Chief Complaint Chief Complaint  Patient presents with  . Cough  . Fever  . Sore Throat    HPI Christine West is a 26 y.o. female.   Last night patient developed flu-like symptoms including myalgias, headache, fever (99.9)/chills, fatigue, and cough.  Also has mild nasal congestion and sore throat.  Cough is non-productive without pleuritic pain or shortness of breath.     The history is provided by the patient.    History reviewed. No pertinent past medical history.  There are no active problems to display for this patient.   History reviewed. No pertinent surgical history.  OB History    Gravida Para Term Preterm AB Living   1             SAB TAB Ectopic Multiple Live Births                   Home Medications    Prior to Admission medications   Medication Sig Start Date End Date Taking? Authorizing Provider  benzonatate (TESSALON) 200 MG capsule Take one cap by mouth at bedtime as needed for cough.  May repeat in 4 to 6 hours 05/29/16   Lattie Haw, MD  hydrOXYzine (ATARAX/VISTARIL) 25 MG tablet Take 1 tablet (25 mg total) by mouth 3 (three) times daily as needed. 02/13/15   Sunnie Nielsen, DO  metroNIDAZOLE (FLAGYL) 500 MG tablet Take 1 tablet (500 mg total) by mouth 2 (two) times daily. 01/18/16   Junius Finner, PA-C  oseltamivir (TAMIFLU) 75 MG capsule Take 1 capsule (75 mg total) by mouth every 12 (twelve) hours. 05/29/16   Lattie Haw, MD  Prenatal MV-Min-Fe Fum-FA-DHA (PRENATAL 1 PO) Take by mouth.    Historical Provider, MD  terconazole (TERAZOL 3) 0.8 % vaginal cream Place 1 applicator vaginally at bedtime. For 3 days 01/17/16   Junius Finner, PA-C  triamcinolone cream (KENALOG) 0.5 % Apply 1 application topically 2 (two) times daily. To affected areas. 02/13/15   Sunnie Nielsen, DO    Family History Family History  Problem Relation Age of Onset   . Diabetes Maternal Grandfather   . Hypertension Paternal Grandfather     Social History Social History  Substance Use Topics  . Smoking status: Never Smoker  . Smokeless tobacco: Never Used  . Alcohol use No     Allergies   Codeine   Review of Systems Review of Systems + sore throat + cough No pleuritic pain No wheezing + nasal congestion + post-nasal drainage No sinus pain/pressure No itchy/red eyes No earache No hemoptysis No SOB + fever, + chills No nausea No vomiting No abdominal pain No diarrhea No urinary symptoms No skin rash + fatigue + myalgias + headache Used OTC meds without relief   Physical Exam Triage Vital Signs ED Triage Vitals  Enc Vitals Group     BP 05/29/16 1707 131/84     Pulse Rate 05/29/16 1707 96     Resp --      Temp 05/29/16 1707 98.7 F (37.1 C)     Temp Source 05/29/16 1707 Oral     SpO2 05/29/16 1707 98 %     Weight 05/29/16 1708 143 lb (64.9 kg)     Height 05/29/16 1708 5\' 2"  (1.575 m)     Head Circumference --      Peak Flow --  Pain Score --      Pain Loc --      Pain Edu? --      Excl. in GC? --    No data found.   Updated Vital Signs BP 131/84 (BP Location: Left Arm)   Pulse 96   Temp 98.7 F (37.1 C) (Oral)   Ht 5\' 2"  (1.575 m)   Wt 143 lb (64.9 kg)   LMP 04/09/2016   SpO2 98%   BMI 26.16 kg/m   Visual Acuity Right Eye Distance:   Left Eye Distance:   Bilateral Distance:    Right Eye Near:   Left Eye Near:    Bilateral Near:     Physical Exam Nursing notes and Vital Signs reviewed. Appearance:  Patient appears stated age, and in no acute distress Eyes:  Pupils are equal, round, and reactive to light and accomodation.  Extraocular movement is intact.  Conjunctivae are not inflamed  Ears:  Canals normal.  Tympanic membranes normal.  Nose:  Mildly congested turbinates.  No sinus tenderness.   Pharynx:  Normal Neck:  Supple.  Tender enlarged posterior/lateral nodes are palpated  bilaterally  Lungs:  Clear to auscultation.  Breath sounds are equal.  Moving air well. Heart:  Regular rate and rhythm without murmurs, rubs, or gallops.  Abdomen:  Nontender without masses or hepatosplenomegaly.  Bowel sounds are present.  No CVA or flank tenderness.  Extremities:  No edema.  Skin:  No rash present.    UC Treatments / Results  Labs (all labs ordered are listed, but only abnormal results are displayed) Labs Reviewed - No data to display  EKG  EKG Interpretation None       Radiology No results found.  Procedures Procedures (including critical care time)  Medications Ordered in UC Medications - No data to display   Initial Impression / Assessment and Plan / UC Course  I have reviewed the triage vital signs and the nursing notes.  Pertinent labs & imaging results that were available during my care of the patient were reviewed by me and considered in my medical decision making (see chart for details).    Begin Tamiflu. Prescription written for Benzonatate [facility](Tessalon) to take at bedtime for night-time cough.  Take plain guaifenesin (1200mg  extended release tabs such as Mucinex) twice daily, with plenty of water, for cough and congestion.  May add Pseudoephedrine (30mg , one or two every 4 to 6 hours) for sinus congestion.  Get adequate rest.   Try warm salt water gargles for sore throat.  Stop all antihistamines for now, and other non-prescription cough/cold preparations. May take Ibuprofen 200mg , 4 tabs every 8 hours with food for body aches, fever, etc. Followup with Family Doctor if not improved in one week.     Final Clinical Impressions(s) / UC Diagnoses   Final diagnoses:  Influenza-like illness    New Prescriptions New Prescriptions   BENZONATATE (TESSALON) 200 MG CAPSULE    Take one cap by mouth at bedtime as needed for cough.  May repeat in 4 to 6 hours   OSELTAMIVIR (TAMIFLU) 75 MG CAPSULE    Take 1 capsule (75 mg total) by mouth every 12  (twelve) hours.     Lattie HawStephen A Zohair Epp, MD 06/02/16 [phone]

## 2016-05-29 NOTE — ED Triage Notes (Signed)
Started last night with sore throat, fever, and coughing.  Temp this am 99.6, and 99.9 before coming in.  Also having generalized aching, and headache.

## 2017-07-14 ENCOUNTER — Encounter: Payer: Self-pay | Admitting: *Deleted

## 2017-07-14 ENCOUNTER — Other Ambulatory Visit: Payer: Self-pay

## 2017-07-14 ENCOUNTER — Emergency Department
Admission: EM | Admit: 2017-07-14 | Discharge: 2017-07-14 | Disposition: A | Discharge status: Home / Self Care | Payer: Self-pay | Source: Home / Self Care | Attending: Family Medicine | Admitting: Family Medicine

## 2017-07-14 DIAGNOSIS — N898 Other specified noninflammatory disorders of vagina: Secondary | ICD-10-CM

## 2017-07-14 DIAGNOSIS — B3731 Acute candidiasis of vulva and vagina: Secondary | ICD-10-CM

## 2017-07-14 DIAGNOSIS — B373 Candidiasis of vulva and vagina: Secondary | ICD-10-CM

## 2017-07-14 MED ORDER — BUTOCONAZOLE NITRATE (1 DOSE) 2 % VA CREA: TOPICAL_CREAM | VAGINAL | 0 refills | Status: DC

## 2017-07-14 NOTE — ED Provider Notes (Signed)
[name]    CSN: [id number] Arrival date & time: 07/14/17  1304     History   Chief Complaint Chief Complaint  Patient presents with  . Vaginal Itching    HPI Christine West is a 27 y.o. female.   Patient complains of onset of vaginal itching and increased vaginal discharge yesterday.  She normally has some mild mucous discharge.  No abdominal or pelvic pain.  No nausea/vomiting.  No fevers, chills, and sweats.  No genital rash.  No urinary symptoms.  Patient's last menstrual period was 06/13/2017.  She denies recent antibiotic use. She reports that "Geralynn OchsGynazole" has worked well in the past for treatment of a vaginal yeast infection.   The history is provided by the patient.  Vaginal Itching  This is a new problem. The current episode started yesterday. The problem occurs constantly. The problem has not changed since onset.Pertinent negatives include no abdominal pain. Nothing aggravates the symptoms. Nothing relieves the symptoms. She has tried nothing for the symptoms.    History reviewed. No pertinent past medical history.  There are no active problems to display for this patient.   History reviewed. No pertinent surgical history.  OB History    Gravida Para Term Preterm AB Living   1             SAB TAB Ectopic Multiple Live Births                   Home Medications    Prior to Admission medications   Medication Sig Start Date End Date Taking? Authorizing Provider  Butoconazole Nitrate, 1 Dose, 2 % CREA Place one applicatorful PV 07/14/17   Lattie HawBeese, Stephen A, MD    Family History Family History  Problem Relation Age of Onset  . Diabetes Maternal Grandfather   . Hypertension Paternal Grandfather     Social History Social History   Tobacco Use  . Smoking status: Never Smoker  . Smokeless tobacco: Never Used  Substance Use Topics  . Alcohol use: No  . Drug use: No     Allergies   Codeine   Review of Systems Review of Systems    Gastrointestinal: Negative for abdominal pain.  All other systems reviewed and are negative.    Physical Exam Triage Vital Signs ED Triage Vitals [07/14/17 1349]  Enc Vitals Group     BP (!) 134/94     Pulse Rate 84     Resp 16     Temp 98.1 F (36.7 C)     Temp Source Oral     SpO2 99 %     Weight 146 lb (66.2 kg)     Height 5\' 2"  (1.575 m)     Head Circumference      Peak Flow      Pain Score 0     Pain Loc      Pain Edu?      Excl. in GC?    No data found.  Updated Vital Signs BP (!) 134/94 (BP Location: Right Arm)   Pulse 84   Temp 98.1 F (36.7 C) (Oral)   Resp 16   Ht 5\' 2"  (1.575 m)   Wt 146 lb (66.2 kg)   LMP 06/13/2017   SpO2 99%   BMI 26.70 kg/m   Visual Acuity Right Eye Distance:   Left Eye Distance:   Bilateral Distance:    Right Eye Near:   Left Eye Near:    Bilateral Near:  Physical Exam Nursing notes and Vital Signs reviewed. Appearance:  Patient appears stated age, and in no acute distress.    Eyes:  Pupils are equal, round, and reactive to light and accomodation.  Extraocular movement is intact.  Conjunctivae are not inflamed   Pharynx:  Normal; moist mucous membranes  Neck:  Supple.  No adenopathy Lungs:  Clear to auscultation.  Breath sounds are equal.  Moving air well. Heart:  Regular rate and rhythm without murmurs, rubs, or gallops.  Abdomen:  Nontender without masses or hepatosplenomegaly.  Bowel sounds are present.  No CVA or flank tenderness.  Extremities:  No edema.  Skin:  No rash present.    Pelvic exam deferred; patient instructed in obtaining self-vaginal specimen UC Treatments / Results  Labs (all labs ordered are listed, but only abnormal results are displayed) Labs Reviewed  C. TRACHOMATIS/N. GONORRHOEAE RNA  POCT WET + KOH PREP:  No clue cells; no trich; no WBC; + pseudohyphae    EKG  EKG Interpretation None       Radiology No results found.  Procedures Procedures (including critical care  time)  Medications Ordered in UC Medications - No data to display   Initial Impression / Assessment and Plan / UC Course  I have reviewed the triage vital signs and the nursing notes.  Pertinent labs & imaging results that were available during my care of the patient were reviewed by me and considered in my medical decision making (see chart for details).    GC/chlamydia pending. Rx for butoconazole nitrate 2%, one applicatorful PV Followup with Family Doctor if not improved in one week.     Final Clinical Impressions(s) / UC Diagnoses   Final diagnoses:  Vaginal itching  Candida vaginitis    ED Discharge Orders        Ordered    Butoconazole Nitrate, 1 Dose, 2 % CREA     07/14/17 1526           Lattie Haw, MD 07/16/17 1357

## 2017-07-14 NOTE — ED Notes (Signed)
Password for labs: [place] [phone]

## 2017-07-14 NOTE — ED Triage Notes (Signed)
Pt c/o vaginal itching x 1 day; clear discharge; reports odor intermittently for a few months.

## 2017-07-15 ENCOUNTER — Telehealth: Payer: Self-pay

## 2017-07-15 LAB — C. TRACHOMATIS/N. GONORRHOEAE RNA
C. trachomatis RNA, TMA: NOT DETECTED
N. GONORRHOEAE RNA, TMA: NOT DETECTED

## 2017-07-15 MED ORDER — TERCONAZOLE 0.8 % VA CREA: 1.0000 | TOPICAL_CREAM | Freq: Every day | VAGINAL | 0 refills | Status: DC

## 2017-07-15 NOTE — Telephone Encounter (Signed)
For persistent itching/discharge, could try another product such as Terazol 3.  Recommend follow-up with GYN if not improving.

## 2017-07-17 ENCOUNTER — Telehealth: Payer: Self-pay | Admitting: *Deleted

## 2017-07-17 NOTE — Telephone Encounter (Signed)
Patient reports she already received her lab results. She is much improved using the cream Dr. Cathren HarshBeese prescribed yesterday.

## 2017-07-18 LAB — POCT WET + KOH PREP

## 2018-03-16 DIAGNOSIS — O0930 Supervision of pregnancy with insufficient antenatal care, unspecified trimester: Secondary | ICD-10-CM | POA: Insufficient documentation

## 2018-03-19 DIAGNOSIS — O26899 Other specified pregnancy related conditions, unspecified trimester: Secondary | ICD-10-CM | POA: Insufficient documentation

## 2018-05-15 ENCOUNTER — Emergency Department (HOSPITAL_BASED_OUTPATIENT_CLINIC_OR_DEPARTMENT_OTHER)
Admission: EM | Admit: 2018-05-15 | Discharge: 2018-05-15 | Disposition: A | Discharge status: Home / Self Care | Payer: PRIVATE HEALTH INSURANCE | Attending: Emergency Medicine | Admitting: Emergency Medicine

## 2018-05-15 ENCOUNTER — Other Ambulatory Visit: Payer: Self-pay

## 2018-05-15 ENCOUNTER — Encounter (HOSPITAL_BASED_OUTPATIENT_CLINIC_OR_DEPARTMENT_OTHER): Payer: Self-pay | Admitting: *Deleted

## 2018-05-15 DIAGNOSIS — B9689 Other specified bacterial agents as the cause of diseases classified elsewhere: Secondary | ICD-10-CM | POA: Insufficient documentation

## 2018-05-15 DIAGNOSIS — Z3A25 25 weeks gestation of pregnancy: Secondary | ICD-10-CM | POA: Diagnosis not present

## 2018-05-15 DIAGNOSIS — N760 Acute vaginitis: Secondary | ICD-10-CM

## 2018-05-15 DIAGNOSIS — O23593 Infection of other part of genital tract in pregnancy, third trimester: Secondary | ICD-10-CM | POA: Diagnosis present

## 2018-05-15 LAB — URINALYSIS, ROUTINE W REFLEX MICROSCOPIC
BILIRUBIN URINE: NEGATIVE
GLUCOSE, UA: NEGATIVE mg/dL
HGB URINE DIPSTICK: NEGATIVE
Ketones, ur: NEGATIVE mg/dL
Nitrite: NEGATIVE
PROTEIN: NEGATIVE mg/dL
Specific Gravity, Urine: 1.015 (ref 1.005–1.030)
pH: 6 (ref 5.0–8.0)

## 2018-05-15 LAB — URINALYSIS, MICROSCOPIC (REFLEX): RBC / HPF: NONE SEEN RBC/hpf (ref 0–5)

## 2018-05-15 LAB — WET PREP, GENITAL
SPERM: NONE SEEN
Trich, Wet Prep: NONE SEEN
YEAST WET PREP: NONE SEEN

## 2018-05-15 MED ORDER — METRONIDAZOLE 0.75 % VA GEL: 1.0000 | Freq: Every day | VAGINAL | 0 refills | Status: AC

## 2018-05-15 MED FILL — metroNIDAZOLE 0.75 % GEL: 0.75 | 5 days supply | Qty: 70 | Fill #0

## 2018-05-15 NOTE — Discharge Instructions (Signed)
There was evidence of possible bacterial vaginosis causing irritation. Apply 1 applicatorful of the metronidazole gel vaginally each night for 5 nights. Follow-up with your OB/GYN as soon as possible for reevaluation.

## 2018-05-15 NOTE — ED Triage Notes (Signed)
Pt is 5.5 months pregnant, has no concerns about baby, says that baby is moving great. She is here for vaginal pain and itching.

## 2018-05-15 NOTE — ED Notes (Signed)
Pt ambulatory to bathroom. Gait equal and balanced.

## 2018-05-15 NOTE — ED Provider Notes (Addendum)
[facility] EMERGENCY DEPARTMENT Provider Note   CSN: [id number] Arrival date & time: 05/15/18  1413     History   Chief Complaint Chief Complaint  Patient presents with  . Vaginal Itching  . Vaginal Pain    HPI Christine West is a 28 y.o. female.  HPI  Christine West is a 28 y.o. female, with a history of G3P1A1, presenting to the ED with vulvar itching and swelling beginning about a week ago.  She does not think she has intravaginal irritation. Sexually active with her husband.   Denies fever/chills, abdominal pain, N/V/D, vaginal bleeding, abnormal vaginal discharge, urinary symptoms, back/flank pain, or any other complaints.   OBGYN is [facility] in [place]. EDD: August 23, 2018 (EGA: [redacted]w[redacted]d). No expected complications. Baby has been moving fine. Last appt was 05/08/2018, next appt is 1/24.   History reviewed. No pertinent past medical history.  There are no active problems to display for this patient.   History reviewed. No pertinent surgical history.   OB History    Gravida  2   Para      Term      Preterm      AB      Living        SAB      TAB      Ectopic      Multiple      Live Births               Home Medications    Prior to Admission medications   Medication Sig Start Date End Date Taking? Authorizing Provider  Butoconazole Nitrate, 1 Dose, 2 % CREA Place one applicatorful PV 07/14/17   Lattie Haw, MD  metroNIDAZOLE (METROGEL) 0.75 % vaginal gel Place 1 Applicatorful vaginally at bedtime for 5 days. 05/15/18 05/20/18  Joy, Shawn C, PA-C  terconazole (TERAZOL 3) 0.8 % vaginal cream Place 1 applicator vaginally at bedtime. Use for 3 days. 07/15/17   Lattie Haw, MD    Family History Family History  Problem Relation Age of Onset  . Diabetes Maternal Grandfather   . Hypertension Paternal Grandfather     Social History Social History   Tobacco Use  . Smoking status: Never Smoker  .  Smokeless tobacco: Never Used  Substance Use Topics  . Alcohol use: No  . Drug use: No     Allergies   Codeine   Review of Systems Review of Systems  Constitutional: Negative for chills, diaphoresis and fever.  Respiratory: Negative for shortness of breath.   Cardiovascular: Negative for chest pain.  Gastrointestinal: Negative for abdominal pain, diarrhea, nausea and vomiting.  Genitourinary: Negative for dysuria, flank pain, hematuria, pelvic pain, vaginal bleeding and vaginal discharge.       Vulvar itching and swelling  Musculoskeletal: Negative for back pain.  Neurological: Negative for dizziness, syncope and weakness.  All other systems reviewed and are negative.    Physical Exam Updated Vital Signs BP 128/81 (BP Location: Left Arm)   Pulse 92   Temp 98.6 F (37 C) (Oral)   Resp 18   Ht 5\' 2"  (1.575 m)   Wt 74.8 kg   LMP 11/16/2017 (Exact Date)   SpO2 100%   BMI 30.18 kg/m   Physical Exam Vitals signs and nursing note reviewed.  Constitutional:      General: She is not in acute distress.    Appearance: She is well-developed. She is not diaphoretic.  HENT:  Head: Normocephalic and atraumatic.     Mouth/Throat:     Mouth: Mucous membranes are moist.     Pharynx: Oropharynx is clear.  Eyes:     Conjunctiva/sclera: Conjunctivae normal.  Neck:     Musculoskeletal: Neck supple.  Cardiovascular:     Rate and Rhythm: Normal rate and regular rhythm.     Pulses: Normal pulses.     Heart sounds: Normal heart sounds.  Pulmonary:     Effort: Pulmonary effort is normal. No respiratory distress.     Breath sounds: Normal breath sounds.  Abdominal:     Palpations: Abdomen is soft.     Tenderness: There is no abdominal tenderness. There is no guarding.  Genitourinary:    Comments: External genitalia abnormal - Small white spots on the mucosal tissue at the vaginal introitus.  Associated with increased sensitivity and irritation with palpation.  No area of  fullness, discrete fluid collection, or fluctuance. Vagina with discharge - Thin, white discharge in the vaginal vault. Cervix  normal  No inguinal lymphadenopathy. Otherwise normal female genitalia. Med Tech, Marylu LundJanet, served as chaperone during exam. Musculoskeletal:     Right lower leg: No edema.     Left lower leg: No edema.  Lymphadenopathy:     Cervical: No cervical adenopathy.  Skin:    General: Skin is warm and dry.  Neurological:     Mental Status: She is alert.  Psychiatric:        Behavior: Behavior normal.      ED Treatments / Results  Labs (all labs ordered are listed, but only abnormal results are displayed) Labs Reviewed  WET PREP, GENITAL - Abnormal; Notable for the following components:      Result Value   Clue Cells Wet Prep HPF POC PRESENT (*)    WBC, Wet Prep HPF POC MANY (*)    All other components within normal limits  URINALYSIS, ROUTINE W REFLEX MICROSCOPIC - Abnormal; Notable for the following components:   APPearance CLOUDY (*)    Leukocytes, UA MODERATE (*)    All other components within normal limits  URINALYSIS, MICROSCOPIC (REFLEX) - Abnormal; Notable for the following components:   Bacteria, UA RARE (*)    All other components within normal limits  URINE CULTURE  GC/CHLAMYDIA PROBE AMP ([place]) NOT AT [facility]    EKG None  Radiology No results found.  Procedures Pelvic exam Date/Time: 05/15/2018 3:13 PM Performed by: Anselm PancoastJoy, Shawn C, PA-C Authorized by: Anselm PancoastJoy, Shawn C, PA-C  Consent: Verbal consent obtained. Risks and benefits: risks, benefits and alternatives were discussed Consent given by: patient Patient identity confirmed: verbally with patient and provided demographic data Local anesthesia used: no  Anesthesia: Local anesthesia used: no  Sedation: Patient sedated: no  Patient tolerance: Patient tolerated the procedure well with no immediate complications    (including critical care time)  Medications Ordered in  ED Medications - No data to display   Initial Impression / Assessment and Plan / ED Course  I have reviewed the triage vital signs and the nursing notes.  Pertinent labs & imaging results that were available during my care of the patient were reviewed by me and considered in my medical decision making (see chart for details).     Patient presents with vulvar irritation and itching.  She has evidence of clue cells on her wet prep.  We will treat her for bacterial vaginosis, but highly recommend she follow-up with her OB/GYN. Rare bacteria noted on UA.  Patient  does not have urinary symptoms.  She also has leukocytes present.  Leukocytes were also present on the wet prep.  I am more inclined to think that these UA findings were due to contamination from the patient's vaginal discharge rather than true bacteriuria.  A urine culture was sent as a precaution. Return precautions discussed.  Patient voices understanding of these instructions, accepts the plan, and is comfortable with discharge.  Final Clinical Impressions(s) / ED Diagnoses   Final diagnoses:  Bacterial vaginitis    ED Discharge Orders         Ordered    metroNIDAZOLE (METROGEL) 0.75 % vaginal gel  Daily at bedtime     05/15/18 1614           Anselm Pancoast, PA-C 05/15/18 1716    Anselm Pancoast, PA-C 05/15/18 1717    Linwood Dibbles, MD 05/18/18 [phone]

## 2018-05-15 NOTE — ED Notes (Signed)
NAD at this time. Pt is stable and going home.  

## 2018-05-16 LAB — URINE CULTURE: Culture: NO GROWTH

## 2018-05-18 LAB — GC/CHLAMYDIA PROBE AMP (~~LOC~~) NOT AT ARMC
CHLAMYDIA, DNA PROBE: NEGATIVE
Neisseria Gonorrhea: NEGATIVE

## 2018-07-24 ENCOUNTER — Emergency Department (INDEPENDENT_AMBULATORY_CARE_PROVIDER_SITE_OTHER)
Admission: EM | Admit: 2018-07-24 | Discharge: 2018-07-24 | Disposition: A | Discharge status: Home / Self Care | Payer: PRIVATE HEALTH INSURANCE | Source: Home / Self Care | Attending: Family Medicine | Admitting: Family Medicine

## 2018-07-24 ENCOUNTER — Encounter: Payer: Self-pay | Admitting: Emergency Medicine

## 2018-07-24 ENCOUNTER — Other Ambulatory Visit: Payer: Self-pay

## 2018-07-24 DIAGNOSIS — H6591 Unspecified nonsuppurative otitis media, right ear: Secondary | ICD-10-CM | POA: Diagnosis not present

## 2018-07-24 DIAGNOSIS — H6123 Impacted cerumen, bilateral: Secondary | ICD-10-CM | POA: Diagnosis not present

## 2018-07-24 MED ORDER — AMOXICILLIN 875 MG PO TABS: 875.0000 mg | ORAL_TABLET | Freq: Two times a day (BID) | ORAL | 0 refills | Status: DC

## 2018-07-24 NOTE — ED Provider Notes (Signed)
[facility]    CSN: [id number] Arrival date & time: 07/24/18  1353     History   Chief Complaint Chief Complaint  Patient presents with  . Ear Problem    HPI Christine West is a 28 y.o. female.   Patient complains of decreased hearing and sensation of fullness in her right for about two days.  She denies pain.  She states that she needs to have her ears flushed about once per year.  The history is provided by the patient.    History reviewed. No pertinent past medical history.  There are no active problems to display for this patient.   History reviewed. No pertinent surgical history.  OB History    Gravida  2   Para      Term      Preterm      AB      Living        SAB      TAB      Ectopic      Multiple      Live Births               Home Medications    Prior to Admission medications   Medication Sig Start Date End Date Taking? Authorizing Provider  Prenatal Vit-Fe Fumarate-FA (MULTIVITAMIN-PRENATAL) 27-0.8 MG TABS tablet Take 1 tablet by mouth daily at 12 noon.   Yes [provider]  amoxicillin (AMOXIL) 875 MG tablet Take 1 tablet (875 mg total) by mouth 2 (two) times daily. 07/24/18   Lattie Haw, MD  Butoconazole Nitrate, 1 Dose, 2 % CREA Place one applicatorful PV 07/14/17   Lattie Haw, MD  terconazole (TERAZOL 3) 0.8 % vaginal cream Place 1 applicator vaginally at bedtime. Use for 3 days. 07/15/17   Lattie Haw, MD    Family History Family History  Problem Relation Age of Onset  . Diabetes Maternal Grandfather   . Hypertension Paternal Grandfather     Social History Social History   Tobacco Use  . Smoking status: Never Smoker  . Smokeless tobacco: Never Used  Substance Use Topics  . Alcohol use: No  . Drug use: No     Allergies   Codeine   Review of Systems Review of Systems No sore throat No cough No pleuritic pain No wheezing No nasal congestion No post-nasal drainage  No sinus pain/pressure No itchy/red eyes No earache, but right ear feels full No hemoptysis No SOB No fever/chills No nausea No vomiting No abdominal pain No diarrhea No urinary symptoms No skin rash No fatigue No myalgias No headache    Physical Exam Triage Vital Signs ED Triage Vitals  Enc Vitals Group     BP 07/24/18 1417 116/68     Pulse Rate 07/24/18 1417 (!) 112     Resp 07/24/18 1417 18     Temp 07/24/18 1417 98.1 F (36.7 C)     Temp Source 07/24/18 1417 Oral     SpO2 07/24/18 1417 96 %     Weight 07/24/18 1418 171 lb (77.6 kg)     Height 07/24/18 1418 5\' 2"  (1.575 m)     Head Circumference --      Peak Flow --      Pain Score 07/24/18 1417 0     Pain Loc --      Pain Edu? --      Excl. in GC? --    No data found.  Updated  Vital Signs BP 116/68 (BP Location: Right Arm)   Pulse (!) 112   Temp 98.1 F (36.7 C) (Oral)   Resp 18   Ht 5\' 2"  (1.575 m)   Wt 77.6 kg Comment: pre-pregnant weight 158  LMP 11/16/2017 (Exact Date)   SpO2 96%   BMI 31.28 kg/m   Visual Acuity Right Eye Distance:   Left Eye Distance:   Bilateral Distance:    Right Eye Near:   Left Eye Near:    Bilateral Near:     Physical Exam Vitals signs and nursing note reviewed.  Constitutional:      General: She is not in acute distress. HENT:     Head: Normocephalic.     Right Ear: External ear normal. There is impacted cerumen.     Left Ear: External ear normal. There is impacted cerumen.     Ears:     Comments: Post bilateral ear lavage, left tympanic membrane normal.  Right tympanic membrane erythematous.    Nose: Nose normal.     Mouth/Throat:     Pharynx: Oropharynx is clear.  Eyes:     Conjunctiva/sclera: Conjunctivae normal.     Pupils: Pupils are equal, round, and reactive to light.  Cardiovascular:     Rate and Rhythm: Tachycardia present.  Pulmonary:     Effort: Pulmonary effort is normal.  Lymphadenopathy:     Cervical: No cervical adenopathy.  Skin:     General: Skin is warm and dry.  Neurological:     Mental Status: She is alert.      UC Treatments / Results  Labs (all labs ordered are listed, but only abnormal results are displayed) Labs Reviewed -   Tympanometry:  Right ear tympanogram positive peak pressure; Left ear tympanogram normal  EKG None  Radiology No results found.  Procedures Procedures (including critical care time)  Medications Ordered in UC Medications - No data to display  Initial Impression / Assessment and Plan / UC Course  I have reviewed the triage vital signs and the nursing notes.  Pertinent labs & imaging results that were available during my care of the patient were reviewed by me and considered in my medical decision making (see chart for details).    Begin amoxicillin for five days. Followup with ENT if not improving.   Final Clinical Impressions(s) / UC Diagnoses   Final diagnoses:  Bilateral impacted cerumen  Right otitis media with effusion     Discharge Instructions     To prevent recurrent ear wax blockage, try the following: Soak two cotton balls with mineral oil, and gently place in each ear canal once weekly.  Leave the cotton balls in place for 10 to 20 minutes.  This will help liquefy the ear wax and aid your body's normal elimination process.  If applicable, do not use a hearing aid for 8 hours overnight.  Have your ears cleaned by a health professional every 6 to 12 months.  Avoid using "Q-tips" and ear wax softening solutions     ED Prescriptions    Medication Sig Dispense Auth. Provider   amoxicillin (AMOXIL) 875 MG tablet Take 1 tablet (875 mg total) by mouth 2 (two) times daily. 10 tablet Lattie Haw, MD         Lattie Haw, MD 07/24/18 [phone]

## 2018-07-24 NOTE — ED Triage Notes (Signed)
Patient senses fullness in right ear over past 2 days and is having difficulty hearing in that ear; no pain. She is [redacted] weeks pregnant; low risk pregnancy. She has been immunized against influenza this season; she has not travelled during past month.

## 2018-07-24 NOTE — Discharge Instructions (Addendum)
To prevent recurrent ear wax blockage, try the following: Soak two cotton balls with mineral oil, and gently place in each ear canal once weekly.  Leave the cotton balls in place for 10 to 20 minutes.  This will help liquefy the ear wax and aid your body's normal elimination process.  If applicable, do not use a hearing aid for 8 hours overnight.  Have your ears cleaned by a health professional every 6 to 12 months.  Avoid using "Q-tips" and ear wax softening solutions  

## 2018-11-25 ENCOUNTER — Encounter (HOSPITAL_BASED_OUTPATIENT_CLINIC_OR_DEPARTMENT_OTHER): Payer: Self-pay | Admitting: Emergency Medicine

## 2018-11-25 ENCOUNTER — Other Ambulatory Visit: Payer: Self-pay

## 2018-11-25 ENCOUNTER — Emergency Department (HOSPITAL_BASED_OUTPATIENT_CLINIC_OR_DEPARTMENT_OTHER)
Admission: EM | Admit: 2018-11-25 | Discharge: 2018-11-25 | Disposition: A | Discharge status: Home / Self Care | Payer: Medicaid Other | Attending: Emergency Medicine | Admitting: Emergency Medicine

## 2018-11-25 DIAGNOSIS — F419 Anxiety disorder, unspecified: Secondary | ICD-10-CM

## 2018-11-25 DIAGNOSIS — I10 Essential (primary) hypertension: Secondary | ICD-10-CM | POA: Insufficient documentation

## 2018-11-25 DIAGNOSIS — R42 Dizziness and giddiness: Secondary | ICD-10-CM | POA: Diagnosis not present

## 2018-11-25 HISTORY — DX: Essential (primary) hypertension: I10

## 2018-11-25 MED ORDER — LORAZEPAM 1 MG PO TABS
1.0000 mg | ORAL_TABLET | Freq: Once | ORAL | Status: AC
  Administered 2018-11-25: 1 mg via ORAL
  Filled 2018-11-25: qty 1

## 2018-11-25 MED ORDER — HYDROXYZINE HCL 25 MG PO TABS
25.0000 mg | ORAL_TABLET | Freq: Once | ORAL | Status: DC
  Filled 2018-11-25: qty 1

## 2018-11-25 MED ORDER — HYDROXYZINE HCL 25 MG PO TABS: 25.0000 mg | ORAL_TABLET | Freq: Four times a day (QID) | ORAL | 0 refills | Status: DC | PRN

## 2018-11-25 NOTE — ED Notes (Signed)
New work note to return on 7/25 approved by MD.

## 2018-11-25 NOTE — ED Notes (Signed)
Patient went up to front to check out and proceeded to tell us that she was dizzy again and does not feel comfortable being D/C. Provider is now at bedside.

## 2018-11-25 NOTE — Discharge Instructions (Signed)
You were evaluated in the Emergency Department and after careful evaluation, we did not find any emergent condition requiring admission or further testing in the hospital.  Your symptoms today seem to be related to anxiety.  You can use the medication provided as needed for anxiety.  We also recommend follow-up with a counselor or psychiatrist.  Please return to the Emergency Department if you experience any worsening of your condition.  We encourage you to follow up with a primary care provider.  Thank you for allowing [place] to be a part of your care.

## 2018-11-25 NOTE — ED Provider Notes (Signed)
[facility] Emergency Department Provider Note MRN:  [id number]  Arrival date & time: 11/25/18     Chief Complaint   Dizziness and Hypertension   History of Present Illness   Christine West is a 28 y.o. year-old female with a history of hypertension presenting to the ED with chief complaint of dizziness and hypertension.  Patient explains that she has been going through very stressful situations for the past several days.  Might lose her job.  Job and financial issues are making her marriage strained.  Has had intermittent dizziness described as a room spinning sensation for the past several days.  Unsure if worse with position or head movement.  Definitely seems worse when in a stressful state, specifically this morning when she was driving she was very anxious and stressed and she had to pull over because of the dizziness.  She denies headache or vision change, no chest pain or shortness of breath, no abdominal pain, no numbness or weakness to the arms or legs.  No ear pain, no hearing loss.  Review of Systems  A complete 10 system review of systems was obtained and all systems are negative except as noted in the HPI and PMH.   Patient's Health History    Past Medical History:  Diagnosis Date  . Hypertension     History reviewed. No pertinent surgical history.  Family History  Problem Relation Age of Onset  . Diabetes Maternal Grandfather   . Hypertension Paternal Grandfather     Social History   Socioeconomic History  . Marital status: Married    Spouse name: Not on file  . Number of children: Not on file  . Years of education: Not on file  . Highest education level: Not on file  Occupational History  . Not on file  Social Needs  . Financial resource strain: Not on file  . Food insecurity    Worry: Not on file    Inability: Not on file  . Transportation needs    Medical: Not on file    Non-medical: Not on file  Tobacco Use  . Smoking  status: Never Smoker  . Smokeless tobacco: Never Used  Substance and Sexual Activity  . Alcohol use: No  . Drug use: No  . Sexual activity: Yes    Birth control/protection: None  Lifestyle  . Physical activity    Days per week: Not on file    Minutes per session: Not on file  . Stress: Not on file  Relationships  . Social Musicianconnections    Talks on phone: Not on file    Gets together: Not on file    Attends religious service: Not on file    Active member of club or organization: Not on file    Attends meetings of clubs or organizations: Not on file    Relationship status: Not on file  . Intimate partner violence    Fear of current or ex partner: Not on file    Emotionally abused: Not on file    Physically abused: Not on file    Forced sexual activity: Not on file  Other Topics Concern  . Not on file  Social History Narrative  . Not on file     Physical Exam  Vital Signs and Nursing Notes reviewed Vitals:   11/25/18 0858  BP: (!) 151/104  Pulse: 79  Resp: 20  Temp: 98 F (36.7 C)  SpO2: 100%    CONSTITUTIONAL: Well-appearing, NAD NEURO:  Alert and oriented x 3, normal and symmetric strength and sensation, normal coordination, normal speech, no visual field cuts, no neglect. EYES:  eyes equal and reactive ENT/NECK:  no LAD, no JVD CARDIO: Regular rate, well-perfused, normal S1 and S2 PULM:  CTAB no wheezing or rhonchi GI/GU:  normal bowel sounds, non-distended, non-tender MSK/SPINE:  No gross deformities, no edema SKIN:  no rash, atraumatic PSYCH:  Appropriate speech and behavior  Diagnostic and Interventional Summary    EKG Interpretation  Date/Time:  Wednesday November 25 2018 09:26:59 EDT Ventricular Rate:  65 PR Interval:    QRS Duration: 94 QT Interval:  369 QTC Calculation: 384 R Axis:   20 Text Interpretation:  Sinus rhythm Nonspecific T abnormalities, anterior leads Confirmed by Gerlene Fee [phone]) on 11/25/2018 9:31:10 AM      Labs Reviewed - No  data to display  No orders to display    Medications  hydrOXYzine (ATARAX/VISTARIL) tablet 25 mg (25 mg Oral Not Given 11/25/18 3267)     Procedures Critical Care  ED Course and Medical Decision Making  I have reviewed the triage vital signs and the nursing notes.  Pertinent labs & imaging results that were available during my care of the patient were reviewed by me and considered in my medical decision making (see below for details).  Favoring anxiety and/or stress as the etiology of patient's symptoms.  She exhibits no neurological deficits, no nystagmus, she is currently asymptomatic.  No associated symptoms to suggest central vertigo.  She had a prior ED visit for cerumen impaction, she continues to have large amount of cerumen in the ears today, which could be contributing.  Her EKG is reassuring.  She is appropriate for discharge with strict return precautions and follow-up with psychiatric resources.  After the discussed management above, the patient was determined to be safe for discharge.  The patient was in agreement with this plan and all questions regarding their care were answered.  ED return precautions were discussed and the patient will return to the ED with any significant worsening of condition.  Barth Kirks. Sedonia Small, MD [place] [email or web address]  Final Clinical Impressions(s) / ED Diagnoses     ICD-10-CM   1. Dizziness  R42   2. Anxiety  F41.9     ED Discharge Orders         Ordered    hydrOXYzine (ATARAX/VISTARIL) 25 MG tablet  Every 6 hours PRN     11/25/18 0933             Maudie Flakes, MD 11/25/18 [phone]

## 2018-11-25 NOTE — ED Triage Notes (Signed)
Pt here with dizziness and high levels of stress currently. Hx of HTN that is generally controlled with medication.

## 2019-11-09 ENCOUNTER — Encounter: Payer: Self-pay | Admitting: Emergency Medicine

## 2019-11-09 ENCOUNTER — Emergency Department (INDEPENDENT_AMBULATORY_CARE_PROVIDER_SITE_OTHER)
Admission: EM | Admit: 2019-11-09 | Discharge: 2019-11-09 | Discharge status: Against Medical Advice | Payer: Medicaid Other | Source: Home / Self Care

## 2019-11-09 DIAGNOSIS — H6121 Impacted cerumen, right ear: Secondary | ICD-10-CM

## 2019-11-09 NOTE — ED Triage Notes (Signed)
Chronic ear wax issues- pt c/o R ear feeling full last 2 weeks - denies pain Pt has been using debrox at home w/ no results No other OTC meds  No COVID vaccine

## 2020-03-20 DIAGNOSIS — B353 Tinea pedis: Secondary | ICD-10-CM | POA: Diagnosis not present

## 2020-03-24 ENCOUNTER — Emergency Department (HOSPITAL_BASED_OUTPATIENT_CLINIC_OR_DEPARTMENT_OTHER): Payer: Medicaid Other

## 2020-03-24 ENCOUNTER — Emergency Department (HOSPITAL_BASED_OUTPATIENT_CLINIC_OR_DEPARTMENT_OTHER)
Admission: EM | Admit: 2020-03-24 | Discharge: 2020-03-24 | Disposition: A | Discharge status: Home / Self Care | Payer: Medicaid Other | Attending: Emergency Medicine | Admitting: Emergency Medicine

## 2020-03-24 ENCOUNTER — Other Ambulatory Visit: Payer: Self-pay

## 2020-03-24 ENCOUNTER — Encounter (HOSPITAL_BASED_OUTPATIENT_CLINIC_OR_DEPARTMENT_OTHER): Payer: Self-pay | Admitting: Emergency Medicine

## 2020-03-24 DIAGNOSIS — I10 Essential (primary) hypertension: Secondary | ICD-10-CM | POA: Diagnosis not present

## 2020-03-24 DIAGNOSIS — M25571 Pain in right ankle and joints of right foot: Secondary | ICD-10-CM | POA: Diagnosis not present

## 2020-03-24 DIAGNOSIS — R2241 Localized swelling, mass and lump, right lower limb: Secondary | ICD-10-CM | POA: Insufficient documentation

## 2020-03-24 DIAGNOSIS — M79674 Pain in right toe(s): Secondary | ICD-10-CM

## 2020-03-24 DIAGNOSIS — Z79899 Other long term (current) drug therapy: Secondary | ICD-10-CM | POA: Diagnosis not present

## 2020-03-24 DIAGNOSIS — M7989 Other specified soft tissue disorders: Secondary | ICD-10-CM

## 2020-03-24 MED ORDER — PENTAFLUOROPROP-TETRAFLUOROETH EX AERO
INHALATION_SPRAY | CUTANEOUS | Status: AC
  Filled 2020-03-24: qty 30

## 2020-03-24 MED ORDER — CEPHALEXIN 500 MG PO CAPS: 500.0000 mg | ORAL_CAPSULE | Freq: Four times a day (QID) | ORAL | 0 refills | Status: DC

## 2020-03-24 MED ORDER — CEPHALEXIN 250 MG PO CAPS
500.0000 mg | ORAL_CAPSULE | Freq: Once | ORAL | Status: AC
  Administered 2020-03-24: 500 mg via ORAL
  Filled 2020-03-24: qty 2

## 2020-03-24 NOTE — ED Triage Notes (Signed)
Pt went to [facility] Monday and diagnosed with athletes foot. States she was to do OTC tinactin. Wednesday she started terbinafine with no relief. She states her foot is more swollen and more painful and getting worse. States she has numbness and unable to bend pinky toe. Toe diffusely swollen and painful, no redness or streaking

## 2020-03-24 NOTE — ED Provider Notes (Signed)
[facility] EMERGENCY DEPARTMENT Provider Note   CSN: [id number] Arrival date & time: 03/24/20  1623     History Chief Complaint  Patient presents with  . Foot Pain    Christine West is a 29 y.o. female.  HPI Patient is a 29 year old female with a medical history as noted below.  She states that she was seen in urgent care 4 days ago and was diagnosed with athlete's foot on the right foot.  She began using OTC Tinactin which did not provide relief.  She then started oral terbinafine 2 days ago.  She states her symptoms have continued to worsen and now notes mild swelling in the right foot as well as exquisite pain in the right fifth toe.  Pain worsens with ambulation.  She noted mild numbness in the toe to the nursing staff but denies any numbness or tingling when I discussed this with her.  No fevers, chills, nausea, vomiting.    Past Medical History:  Diagnosis Date  . Hypertension     Patient Active Problem List   Diagnosis Date Noted  . Rh negative state in antepartum period 03/19/2018  . Late prenatal care 03/16/2018    History reviewed. No pertinent surgical history.   OB History    Gravida  2   Para      Term      Preterm      AB      Living        SAB      TAB      Ectopic      Multiple      Live Births              Family History  Problem Relation Age of Onset  . Diabetes Maternal Grandfather   . Hypertension Paternal Grandfather   . Healthy Mother   . Healthy Father     Social History   Tobacco Use  . Smoking status: Never Smoker  . Smokeless tobacco: Never Used  Vaping Use  . Vaping Use: Never used  Substance Use Topics  . Alcohol use: No  . Drug use: No    Home Medications Prior to Admission medications   Medication Sig Start Date End Date Taking? Authorizing Provider  hydrOXYzine (ATARAX/VISTARIL) 25 MG tablet Take 1 tablet (25 mg total) by mouth every 6 (six) hours as needed for anxiety. 11/25/18   Sabas Sous, MD  labetalol (NORMODYNE) 300 MG tablet Take 600 mg by mouth 2 (two) times daily. 10/24/18   [provider]    Allergies    Asparagus, Other, and Codeine  Review of Systems   Review of Systems  Constitutional: Negative for chills and fever.  Gastrointestinal: Negative for nausea and vomiting.  Musculoskeletal: Positive for joint swelling and myalgias.  Skin: Positive for color change. Negative for wound.  Neurological: Negative for weakness and numbness.   Physical Exam Updated Vital Signs BP (!) 144/100   Pulse 81   Temp 98 F (36.7 C) (Oral)   Resp 16   Ht 5\' 2"  (1.575 m)   Wt 74.8 kg   LMP 03/20/2020   SpO2 99%   BMI 30.18 kg/m   Physical Exam Vitals and nursing note reviewed.  Constitutional:      General: She is not in acute distress.    Appearance: She is well-developed.  HENT:     Head: Normocephalic and atraumatic.     Right Ear: External ear normal.  Left Ear: External ear normal.  Eyes:     General: No scleral icterus.       Right eye: No discharge.        Left eye: No discharge.     Conjunctiva/sclera: Conjunctivae normal.  Neck:     Trachea: No tracheal deviation.  Cardiovascular:     Rate and Rhythm: Normal rate.  Pulmonary:     Effort: Pulmonary effort is normal. No respiratory distress.     Breath sounds: No stridor.  Abdominal:     General: There is no distension.  Musculoskeletal:        General: Tenderness present. No swelling or deformity.     Cervical back: Neck supple.     Comments: Moderate tenderness and mild swelling noted along the right fifth toe diffusely.  Pain worsens with any movement of the toe.  Additional mild soft tissue swelling noted along the dorsum of the right foot.  Patient is neurovascularly intact in the right foot and all of the digits of the right foot.  Palpable pedal pulses.  Small developing blister noted along the webspace between the fourth and fifth toes of the right foot as well as along  the medial aspect of the right fifth toe.  Skin:    General: Skin is warm and dry.     Findings: Erythema present. No rash.  Neurological:     Mental Status: She is alert.     Cranial Nerves: Cranial nerve deficit: no gross deficits.    ED Results / Procedures / Treatments   Labs (all labs ordered are listed, but only abnormal results are displayed) Labs Reviewed - No data to display  EKG None  Radiology DG Foot Complete Right  Result Date: 03/24/2020 CLINICAL DATA:  29 year old female with right lateral foot pain and swelling for 1 week. Reportedly began is athlete's foot. Painful weight-bearing. EXAM: RIGHT FOOT COMPLETE - 3+ VIEW COMPARISON:  None. FINDINGS: There is moderate to severe soft tissue swelling about the lateral foot, and along both sides of the 5th metatarsals and phalanges. No soft tissue gas. No radiopaque foreign body identified. The soft tissue swelling seems to deviate the 5th ray somewhat laterally. But no underlying bone erosion or osseous abnormality is identified. IMPRESSION: Moderate to severe soft tissue swelling about the right 5th ray with no osseous abnormality identified. Electronically Signed   By: Odessa Fleming M.D.   On: 03/24/2020 17:41   Procedures Procedures (including critical care time)  Medications Ordered in ED Medications  pentafluoroprop-tetrafluoroeth (GEBAUERS) aerosol (  Given by Other 03/24/20 2209)  cephALEXin (KEFLEX) capsule 500 mg (500 mg Oral Given 03/24/20 2307)   ED Course  I have reviewed the triage vital signs and the nursing notes.  Pertinent labs & imaging results that were available during my care of the patient were reviewed by me and considered in my medical decision making (see chart for details).    MDM Rules/Calculators/A&P                           Pt is a 29 y/o female that presents with right fifth toe pain and swelling. Initially pt was seen at [facility] and dx with athletes foot. Her sx have continued to worsen for past  five days while applying tinactin and taking PO terbinafine.   Pt has mild swelling and redness to the toe with diffuse palpable pain. Addition mild swelling to the dorsum of the foot. NVI  in the right foot and all of the toes. Developing blister in the webbed spaced between the fourth and fifth toes and along the medial aspect of the fifth toe.   No significant increased warmth of erythema in the region. Xrays show soft tissue swelling without osseous abnormality. Doubt osteomyelitis at this time. Does not appear to be a paronychia. Pt is afebrile and not tachycardic.   Pt requested that I incise the blister which was done without complication. Pt notes mild relief of her pain. Will start her on keflex to prevent possible infection. First dose in the ED. Continued use of tylenol and ibuprofen for pain. We discussed dosing.   Her questions were answered and she was amicable at the time of d/c. Given strict return precautions. She verbalized understanding of the above plan. VSS.   Final Clinical Impression(s) / ED Diagnoses Final diagnoses:  Pain and swelling of toe of right foot   Rx / DC Orders ED Discharge Orders         Ordered    cephALEXin (KEFLEX) 500 MG capsule  4 times daily        03/24/20 2247           Placido Sou, PA-C 03/25/20 1314    Linwood Dibbles, MD 03/25/20 1458

## 2020-03-24 NOTE — Discharge Instructions (Signed)
I prescribed you an antibiotic called Keflex.  You are going to take this 4 times a day for the next 5 days.  Please do not stop taking this early.  Please return to the emergency department if you develop any new or worsening symptoms.  I recommend a combination of tylenol and ibuprofen for management of your pain. You can take a low dose of both at the same time. I recommend 325 mg of Tylenol combined with 400 mg of ibuprofen. This is one regular Tylenol and two regular ibuprofen. You can take these 2-3 times for day for your pain. Please try to take these medications with a small amount of food as well to prevent upsetting your stomach.  It was a pleasure to meet you.

## 2020-03-24 NOTE — ED Notes (Signed)
I&D tray at bedside. Painease provided for pain relief.

## 2020-03-24 NOTE — ED Notes (Signed)
Discharge instructions discussed with patient. Verbalized understanding. Departs ED at this time in stable condition with gauze applied to toe.

## 2020-05-01 ENCOUNTER — Emergency Department (INDEPENDENT_AMBULATORY_CARE_PROVIDER_SITE_OTHER)
Admission: EM | Admit: 2020-05-01 | Discharge: 2020-05-01 | Disposition: A | Discharge status: Home / Self Care | Payer: 59 | Source: Home / Self Care

## 2020-05-01 ENCOUNTER — Encounter: Payer: Self-pay | Admitting: Family Medicine

## 2020-05-01 ENCOUNTER — Other Ambulatory Visit: Payer: Self-pay

## 2020-05-01 DIAGNOSIS — U071 COVID-19: Secondary | ICD-10-CM

## 2020-05-01 DIAGNOSIS — J069 Acute upper respiratory infection, unspecified: Secondary | ICD-10-CM | POA: Diagnosis not present

## 2020-05-01 DIAGNOSIS — Z20822 Contact with and (suspected) exposure to covid-19: Secondary | ICD-10-CM

## 2020-05-01 LAB — POC SARS CORONAVIRUS 2 AG -  ED: SARS Coronavirus 2 Ag: POSITIVE — AB

## 2020-05-01 MED ORDER — BENZONATATE 100 MG PO CAPS: 100.0000 mg | ORAL_CAPSULE | Freq: Three times a day (TID) | ORAL | 0 refills | Status: DC | PRN

## 2020-05-01 NOTE — ED Provider Notes (Signed)
[facility]    CSN: [id number] Arrival date & time: 05/01/20  1058      History   Chief Complaint Chief Complaint  Patient presents with  . Cough    HPI Christine West is a 29 y.o. female.   This is an established [place] urgent care patient, 29 year old woman, complaining of rhinorrhea, cough, sore throat, and possible Covid.  Symptoms began 5 days ago.  Her children who are in daycare were sick last week but they are feeling better now.  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  She is not aware of any Covid contact that she has had in the last 10 days.     Past Medical History:  Diagnosis Date  . Hypertension     Patient Active Problem List   Diagnosis Date Noted  . Rh negative state in antepartum period 03/19/2018  . Late prenatal care 03/16/2018    History reviewed. No pertinent surgical history.  OB History    Gravida  2   Para      Term      Preterm      AB      Living        SAB      IAB      Ectopic      Multiple      Live Births               Home Medications    Prior to Admission medications   Medication Sig Start Date End Date Taking? Authorizing Provider  acetaminophen (TYLENOL) 325 MG tablet Take 650 mg by mouth every 6 (six) hours as needed.   Yes [provider]    Family History Family History  Problem Relation Age of Onset  . Diabetes Maternal Grandfather   . Hypertension Paternal Grandfather   . Healthy Mother   . Healthy Father     Social History Social History   Tobacco Use  . Smoking status: Never Smoker  . Smokeless tobacco: Never Used  Vaping Use  . Vaping Use: Never used  Substance Use Topics  . Alcohol use: No  . Drug use: No     Allergies   Asparagus, Other, and Codeine   Review of Systems Review of Systems  Constitutional: Positive for fever.  HENT: Positive for congestion, nosebleeds and sore throat.   Respiratory: Positive for  cough.      Physical Exam Triage Vital Signs ED Triage Vitals  Enc Vitals Group     BP      Pulse      Resp      Temp      Temp src      SpO2      Weight      Height      Head Circumference      Peak Flow      Pain Score      Pain Loc      Pain Edu?      Excl. in GC?    No data found.  Updated Vital Signs BP 136/89 (BP Location: Right Arm)   Pulse (!) 113   Temp 97.9 F (36.6 C) (Oral)   Ht 5\' 2"  (1.575 m)   Wt 77.1 kg   SpO2 99%   BMI 31.09 kg/m    Physical Exam Vitals and nursing note reviewed.  Constitutional:      Appearance: Normal appearance. She is normal weight.  HENT:  Head: Normocephalic.     Right Ear: Tympanic membrane normal.     Left Ear: Tympanic membrane normal.     Nose: Congestion present.     Mouth/Throat:     Pharynx: Oropharynx is clear.  Eyes:     Conjunctiva/sclera: Conjunctivae normal.  Cardiovascular:     Rate and Rhythm: Normal rate and regular rhythm.  Pulmonary:     Effort: Pulmonary effort is normal.     Breath sounds: Normal breath sounds.  Musculoskeletal:        General: Normal range of motion.     Cervical back: Normal range of motion and neck supple.  Skin:    General: Skin is warm and dry.  Neurological:     General: No focal deficit present.     Mental Status: She is alert and oriented to person, place, and time.  Psychiatric:        Mood and Affect: Mood normal.      UC Treatments / Results  Labs (all labs ordered are listed, but only abnormal results are displayed) Labs Reviewed  POC SARS CORONAVIRUS 2 AG -  ED    EKG   Radiology No results found.  Procedures Procedures (including critical care time)  Medications Ordered in UC Medications - No data to display  Initial Impression / Assessment and Plan / UC Course  I have reviewed the triage vital signs and the nursing notes.  Pertinent labs & imaging results that were available during my care of the patient were reviewed by me and  considered in my medical decision making (see chart for details).    Final Clinical Impressions(s) / UC Diagnoses   Final diagnoses:  Exposure to COVID-19 virus   Discharge Instructions   None    ED Prescriptions    None     I have reviewed the PDMP during this encounter.   Elvina Sidle, MD 05/01/20 1220

## 2020-05-01 NOTE — ED Triage Notes (Signed)
Productive cough, sore throat x 5 days. Vaccinated

## 2020-05-02 ENCOUNTER — Telehealth: Payer: Self-pay

## 2020-05-02 MED ORDER — CHLORHEXIDINE GLUCONATE 0.12 % MT SOLN: 15.0000 mL | Freq: Two times a day (BID) | OROMUCOSAL | 0 refills | Status: DC

## 2020-07-14 DIAGNOSIS — Z3202 Encounter for pregnancy test, result negative: Secondary | ICD-10-CM | POA: Diagnosis not present

## 2020-07-14 DIAGNOSIS — Z0000 Encounter for general adult medical examination without abnormal findings: Secondary | ICD-10-CM | POA: Diagnosis not present

## 2020-07-14 DIAGNOSIS — N644 Mastodynia: Secondary | ICD-10-CM | POA: Diagnosis not present

## 2020-07-14 DIAGNOSIS — N938 Other specified abnormal uterine and vaginal bleeding: Secondary | ICD-10-CM | POA: Diagnosis not present

## 2020-07-19 ENCOUNTER — Other Ambulatory Visit (HOSPITAL_COMMUNITY): Payer: Self-pay | Admitting: Pharmacist

## 2020-08-01 DIAGNOSIS — Z20822 Contact with and (suspected) exposure to covid-19: Secondary | ICD-10-CM | POA: Diagnosis not present

## 2020-08-01 DIAGNOSIS — J101 Influenza due to other identified influenza virus with other respiratory manifestations: Secondary | ICD-10-CM | POA: Diagnosis not present

## 2020-08-01 DIAGNOSIS — J029 Acute pharyngitis, unspecified: Secondary | ICD-10-CM | POA: Diagnosis not present

## 2020-08-01 DIAGNOSIS — R059 Cough, unspecified: Secondary | ICD-10-CM | POA: Diagnosis not present

## 2021-10-09 ENCOUNTER — Emergency Department (HOSPITAL_BASED_OUTPATIENT_CLINIC_OR_DEPARTMENT_OTHER): Payer: Medicaid Other

## 2021-10-09 ENCOUNTER — Other Ambulatory Visit: Payer: Self-pay

## 2021-10-09 ENCOUNTER — Encounter (HOSPITAL_BASED_OUTPATIENT_CLINIC_OR_DEPARTMENT_OTHER): Payer: Self-pay | Admitting: Emergency Medicine

## 2021-10-09 ENCOUNTER — Emergency Department (HOSPITAL_BASED_OUTPATIENT_CLINIC_OR_DEPARTMENT_OTHER)
Admission: EM | Admit: 2021-10-09 | Discharge: 2021-10-09 | Disposition: A | Discharge status: Home / Self Care | Payer: Medicaid Other | Attending: Emergency Medicine | Admitting: Emergency Medicine

## 2021-10-09 DIAGNOSIS — R7309 Other abnormal glucose: Secondary | ICD-10-CM | POA: Diagnosis not present

## 2021-10-09 DIAGNOSIS — R253 Fasciculation: Secondary | ICD-10-CM | POA: Diagnosis not present

## 2021-10-09 DIAGNOSIS — R0789 Other chest pain: Secondary | ICD-10-CM | POA: Diagnosis present

## 2021-10-09 DIAGNOSIS — R079 Chest pain, unspecified: Secondary | ICD-10-CM

## 2021-10-09 LAB — BASIC METABOLIC PANEL
Anion gap: 6 (ref 5–15)
BUN: 14 mg/dL (ref 6–20)
CO2: 25 mmol/L (ref 22–32)
Calcium: 9.2 mg/dL (ref 8.9–10.3)
Chloride: 107 mmol/L (ref 98–111)
Creatinine, Ser: 0.68 mg/dL (ref 0.44–1.00)
GFR, Estimated: >60 mL/min (ref >60)
Glucose, Bld: 125 mg/dL — ABNORMAL HIGH (ref 70–99)
Potassium: 4.2 mmol/L (ref 3.5–5.1)
Sodium: 138 mmol/L (ref 135–145)

## 2021-10-09 LAB — CBC
HCT: 39.2 % (ref 36.0–46.0)
Hemoglobin: 13.8 g/dL (ref 12.0–15.0)
MCH: 30.5 pg (ref 26.0–34.0)
MCHC: 35.2 g/dL (ref 30.0–36.0)
MCV: 86.5 fL (ref 80.0–100.0)
Platelets: 197 10*3/uL (ref 150–400)
RBC: 4.53 MIL/uL (ref 3.87–5.11)
RDW: 11.7 % (ref 11.5–15.5)
WBC: 9.5 10*3/uL (ref 4.0–10.5)
nRBC: 0 % (ref 0.0–0.2)

## 2021-10-09 LAB — TROPONIN I (HIGH SENSITIVITY): Troponin I (High Sensitivity): <2 ng/L (ref <18)

## 2021-10-09 MED ORDER — ALUM & MAG HYDROXIDE-SIMETH 200-200-20 MG/5ML PO SUSP
30.0000 mL | Freq: Once | ORAL | Status: DC
  Filled 2021-10-09: qty 30

## 2021-10-09 MED ORDER — LIDOCAINE VISCOUS HCL 2 % MT SOLN
15.0000 mL | Freq: Once | OROMUCOSAL | Status: DC
  Filled 2021-10-09: qty 15

## 2021-10-09 NOTE — ED Notes (Signed)
Written and verbal inst to pt  Verbalized an understanding  To home  

## 2021-10-09 NOTE — ED Provider Triage Note (Signed)
Emergency Medicine Provider Triage Evaluation Note  Christine West , a 31 y.o. female  was evaluated in triage.  Pt complains of chest pain. Patient states chest pain began around 645PM and was walking when it occurred. The patient states that the pain is nonradiating, sharp in nature. Patient reports that she ate a meal 30 minutes prior to CP beginning. No SOB, fevers, leg swelling, abdominal pain, nausea, vomiting, weakness. Patient also states her right eye has been twitching today.   Review of Systems  Positive:  Negative:   Physical Exam  There were no vitals taken for this visit. Gen:   Awake, no distress   Resp:  Normal effort  MSK:   Moves extremities without difficulty  Other:    Medical Decision Making  Medically screening exam initiated at 8:44 PM.  Appropriate orders placed.  Amando T Davidoff was informed that the remainder of the evaluation will be completed by another provider, this initial triage assessment does not replace that evaluation, and the importance of remaining in the ED until their evaluation is complete.     Al Decant, PA-C 10/09/21 2045

## 2021-10-09 NOTE — ED Triage Notes (Addendum)
Pt arrives pov ambulatory c/o two episodes of sharp central cp pta. Denies radiation, shob, n/v, dizziness. Pt reports she's been under a lot of stress recently. Denies cp at this time.

## 2021-10-09 NOTE — ED Provider Notes (Signed)
[facility] EMERGENCY DEPARTMENT Provider Note   CSN: [id number] Arrival date & time: 10/09/21  1915     History  Chief Complaint  Patient presents with   Chest Pain    Christine West is a 31 y.o. female with medical history of hypertension.  Patient presents to the ED for evaluation of chest pain. Patient states chest pain began around 645PM and was walking when it occurred. The patient states the pain is located in the middle of her chest. The patient states that the pain is nonradiating, sharp in nature. Patient reports that she ate a meal 30 minutes prior to chest pain beginning. Patient denies SOB, fevers, leg swelling, abdominal pain, nausea, vomiting, weakness.  Patient denies any active chest pain at time of interview.  Patient also states her right eye has been twitching today.    Chest Pain Associated symptoms: no abdominal pain, no nausea, no shortness of breath, no vomiting and no weakness       Home Medications Prior to Admission medications   Medication Sig Start Date End Date Taking? Authorizing Provider  acetaminophen (TYLENOL) 325 MG tablet Take 650 mg by mouth every 6 (six) hours as needed.    [provider]  benzonatate (TESSALON) 100 MG capsule Take 1-2 capsules (100-200 mg total) by mouth 3 (three) times daily as needed for cough. 05/01/20   Elvina Sidle, MD  chlorhexidine (PERIDEX) 0.12 % solution Use as directed 15 mLs in the mouth or throat 2 (two) times daily. 05/02/20   Elvina Sidle, MD      Allergies    Asparagus, Other, and Codeine    Review of Systems   Review of Systems  Respiratory:  Negative for shortness of breath.   Cardiovascular:  Positive for chest pain. Negative for leg swelling.  Gastrointestinal:  Negative for abdominal pain, nausea and vomiting.  Neurological:  Negative for weakness.  All other systems reviewed and are negative.  Physical Exam Updated Vital Signs BP (!) 136/95   Pulse 78   Temp 98.5  F (36.9 C) (Oral)   Resp 18   SpO2 100%  Physical Exam Vitals and nursing note reviewed.  Constitutional:      General: She is not in acute distress.    Appearance: Normal appearance. She is well-developed. She is not ill-appearing, toxic-appearing or diaphoretic.  HENT:     Head: Normocephalic and atraumatic.     Nose: Nose normal.     Mouth/Throat:     Mouth: Mucous membranes are moist.     Pharynx: Oropharynx is clear.  Eyes:     Extraocular Movements: Extraocular movements intact.     Pupils: Pupils are equal, round, and reactive to light.  Cardiovascular:     Rate and Rhythm: Normal rate and regular rhythm.  Pulmonary:     Effort: Pulmonary effort is normal.     Breath sounds: Normal breath sounds. No decreased breath sounds, wheezing or rhonchi.  Chest:     Chest wall: No tenderness.  Abdominal:     General: Bowel sounds are normal.     Palpations: Abdomen is soft.     Tenderness: There is no abdominal tenderness.  Musculoskeletal:     Cervical back: Normal range of motion and neck supple.     Right lower leg: No edema.     Left lower leg: No edema.  Skin:    General: Skin is warm and dry.     Capillary Refill: Capillary refill takes less than  2 seconds.  Neurological:     Mental Status: She is alert and oriented to person, place, and time.    ED Results / Procedures / Treatments   Labs (all labs ordered are listed, but only abnormal results are displayed) Labs Reviewed  BASIC METABOLIC PANEL - Abnormal; Notable for the following components:      Result Value   Glucose, Bld 125 (*)    All other components within normal limits  CBC  TROPONIN I (HIGH SENSITIVITY)    EKG None  Radiology DG Chest 2 View  Result Date: 10/09/2021 CLINICAL DATA:  Chest pain. EXAM: CHEST - 2 VIEW COMPARISON:  05/30/2011 FINDINGS: The cardiomediastinal contours are normal. The lungs are clear. Pulmonary vasculature is normal. No consolidation, pleural effusion, or pneumothorax.  No acute osseous abnormalities are seen. IMPRESSION: Negative radiographs of the chest. Electronically Signed   By: Narda Rutherford M.D.   On: 10/09/2021 20:00    Procedures Procedures    Medications Ordered in ED Medications - No data to display  ED Course/ Medical Decision Making/ A&P                           Medical Decision Making Amount and/or Complexity of Data Reviewed Labs: ordered. Radiology: ordered.   31 year old female presents to the ED.  Please see HPI for further details.  On examination, the patient is afebrile, nontachycardic.  The patient's lung sounds are clear bilaterally and she is nonhypoxic on room air.  The patient's abdomen is soft and compressible in all 4 quadrants.  The patient's legs show no signs of edema.  Patient alert and oriented x3.  Patient worked up utilizing the following labs and imaging studies interpreted by me personally: - BMP shows elevated glucose to 125 - CBC unremarkable - Troponin less than 2 - Chest x-ray shows no focal consolidation, mediastinal widening, effusion - EKG shows normal sinus rhythm  On reassessment, the patient denies any chest pain.  At this time, the patient is stable for discharge.  The patient will be referred to a PCP for further management.  The patient was given return precautions and she voiced understanding of these.  The patient had all of her questions answered to her satisfaction.  Patient stable this time for discharge.   Final Clinical Impression(s) / ED Diagnoses Final diagnoses:  Chest pain, unspecified type    Rx / DC Orders ED Discharge Orders     None         Al Decant, PA-C 10/09/21 2341    Maia Plan, MD 10/10/21 1252

## 2021-10-09 NOTE — ED Notes (Signed)
Patient denies any pain, is in NAD lying in bed.  Respirations even and unlabored.  Call bell within reach.

## 2021-10-09 NOTE — Discharge Instructions (Addendum)
Please return to the ED with any new symptoms such as increased chest pain, shortness of breath, leg swelling Please read attached informational guide concerning nonspecific chest pain in adults Please follow-up with the PCP I referred you to.  You will need to call and make an appoint to be seen. Please find attached work note

## 2021-12-18 ENCOUNTER — Encounter (HOSPITAL_BASED_OUTPATIENT_CLINIC_OR_DEPARTMENT_OTHER): Payer: Self-pay | Admitting: Emergency Medicine

## 2021-12-18 ENCOUNTER — Emergency Department (HOSPITAL_BASED_OUTPATIENT_CLINIC_OR_DEPARTMENT_OTHER)
Admission: EM | Admit: 2021-12-18 | Discharge: 2021-12-18 | Disposition: A | Discharge status: Home / Self Care | Payer: Medicaid Other | Attending: Emergency Medicine | Admitting: Emergency Medicine

## 2021-12-18 ENCOUNTER — Other Ambulatory Visit: Payer: Self-pay

## 2021-12-18 DIAGNOSIS — L03317 Cellulitis of buttock: Secondary | ICD-10-CM | POA: Diagnosis not present

## 2021-12-18 DIAGNOSIS — L739 Follicular disorder, unspecified: Secondary | ICD-10-CM | POA: Diagnosis not present

## 2021-12-18 DIAGNOSIS — R2242 Localized swelling, mass and lump, left lower limb: Secondary | ICD-10-CM | POA: Diagnosis present

## 2021-12-18 MED ORDER — DOXYCYCLINE HYCLATE 100 MG PO CAPS: 100.0000 mg | ORAL_CAPSULE | Freq: Two times a day (BID) | ORAL | 0 refills | Status: DC

## 2021-12-18 MED ORDER — DOXYCYCLINE HYCLATE 100 MG PO TABS
100.0000 mg | ORAL_TABLET | Freq: Once | ORAL | Status: AC
  Administered 2021-12-18: 100 mg via ORAL
  Filled 2021-12-18: qty 1

## 2021-12-18 NOTE — ED Provider Notes (Signed)
   [facility] EMERGENCY DEPARTMENT  Provider Note  CSN: [id number] Arrival date & time: 12/18/21 0041  History Chief Complaint  Patient presents with   Abscess    Christine West is a 31 y.o. female reports she has had a boil on her L buttock for a few days, tried messing with it at home but still sore. Worse when walking because it rubs. No fevers. No drainage.    Home Medications Prior to Admission medications   Medication Sig Start Date End Date Taking? Authorizing Provider  doxycycline (VIBRAMYCIN) 100 MG capsule Take 1 capsule (100 mg total) by mouth 2 (two) times daily. 12/18/21  Yes Pollyann Savoy, MD  acetaminophen (TYLENOL) 325 MG tablet Take 650 mg by mouth every 6 (six) hours as needed.    [provider]     Allergies    Asparagus, Other, and Codeine   Review of Systems   Review of Systems Please see HPI for pertinent positives and negatives  Physical Exam BP (!) 149/111 (BP Location: Left Arm)   Pulse 86   Temp 98.6 F (37 C) (Oral)   Resp 18   Ht 5\' 2"  (1.575 m)   Wt 75.2 kg   LMP 12/07/2021 (Exact Date)   SpO2 95%   BMI 30.33 kg/m   Physical Exam Vitals and nursing note reviewed.  HENT:     Head: Normocephalic.     Nose: Nose normal.  Eyes:     Extraocular Movements: Extraocular movements intact.  Pulmonary:     Effort: Pulmonary effort is normal.  Musculoskeletal:        General: Normal range of motion.     Cervical back: Neck supple.  Skin:    Findings: No rash (on exposed skin).     Comments: Chaperone present. There is a small shallow ulcerated lesion on L medial buttock, likely a small pustule that has spontaneously drained. Mild surrounding cellulitis.   Neurological:     Mental Status: She is alert and oriented to person, place, and time.  Psychiatric:        Mood and Affect: Mood normal.     ED Results / Procedures / Treatments   EKG None  Procedures Procedures  Medications Ordered in the  ED Medications  doxycycline (VIBRA-TABS) tablet 100 mg (has no administration in time range)    Initial Impression and Plan  Patient with a small lesion likely a drained pustule/folliculitis. No fluctuance in need for ED drainage. Rx doxycycline. Wound care instructions.   ED Course       MDM Rules/Calculators/A&P Medical Decision Making Problems Addressed: Folliculitis: acute illness or injury  Risk Prescription drug management.    Final Clinical Impression(s) / ED Diagnoses Final diagnoses:  Folliculitis    Rx / DC Orders ED Discharge Orders          Ordered    doxycycline (VIBRAMYCIN) 100 MG capsule  2 times daily        12/18/21 0338             12/20/21, MD 12/18/21 [phone]

## 2021-12-18 NOTE — ED Triage Notes (Signed)
Patient arrived via POV c/o abscess to lower left buttocks. Patient states this has been going on x 2 weeks. Patient states 7/10 pain. Patient is AO x 4, VS w/ elevated BP, normal gait.

## 2022-10-21 IMAGING — DX DG CHEST 2V
2 series · 2 of 2 positions shown · non-contrast
Comparison: 05/30/2011

CLINICAL DATA: Chest pain.

EXAM:
CHEST - 2 VIEW

[chest pa]
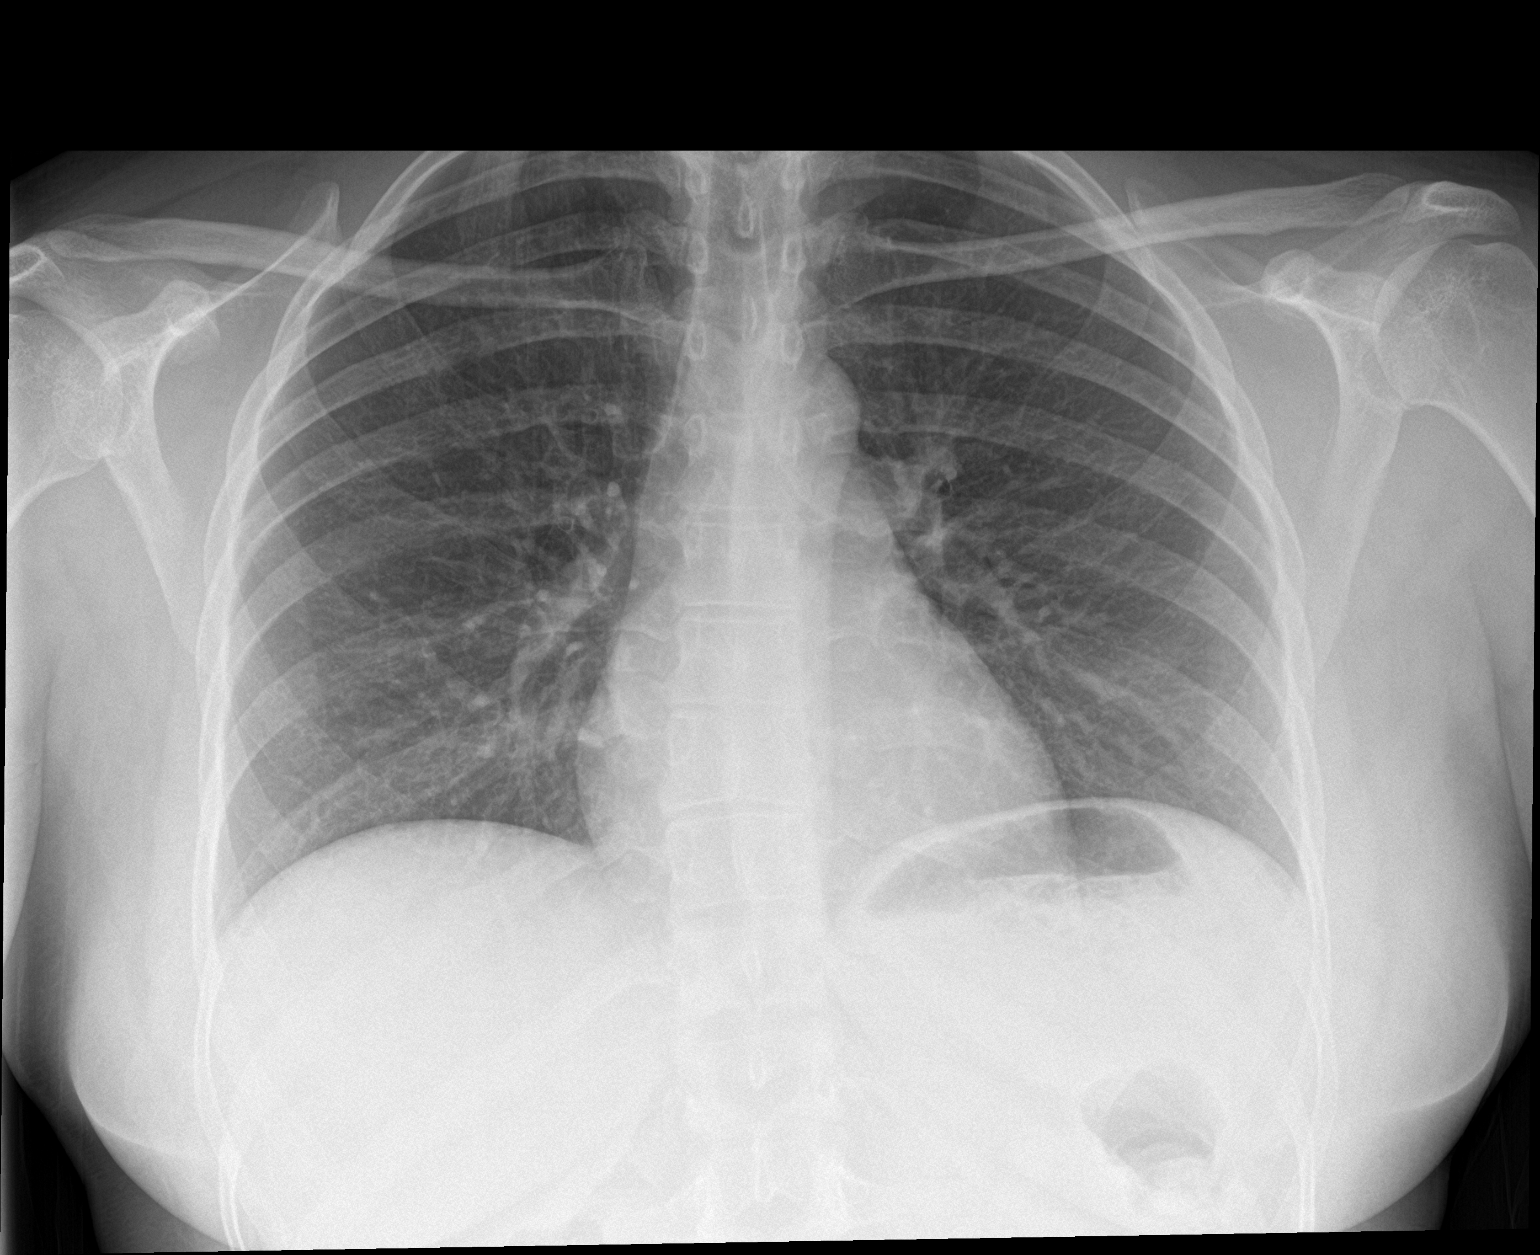

[chest lat]
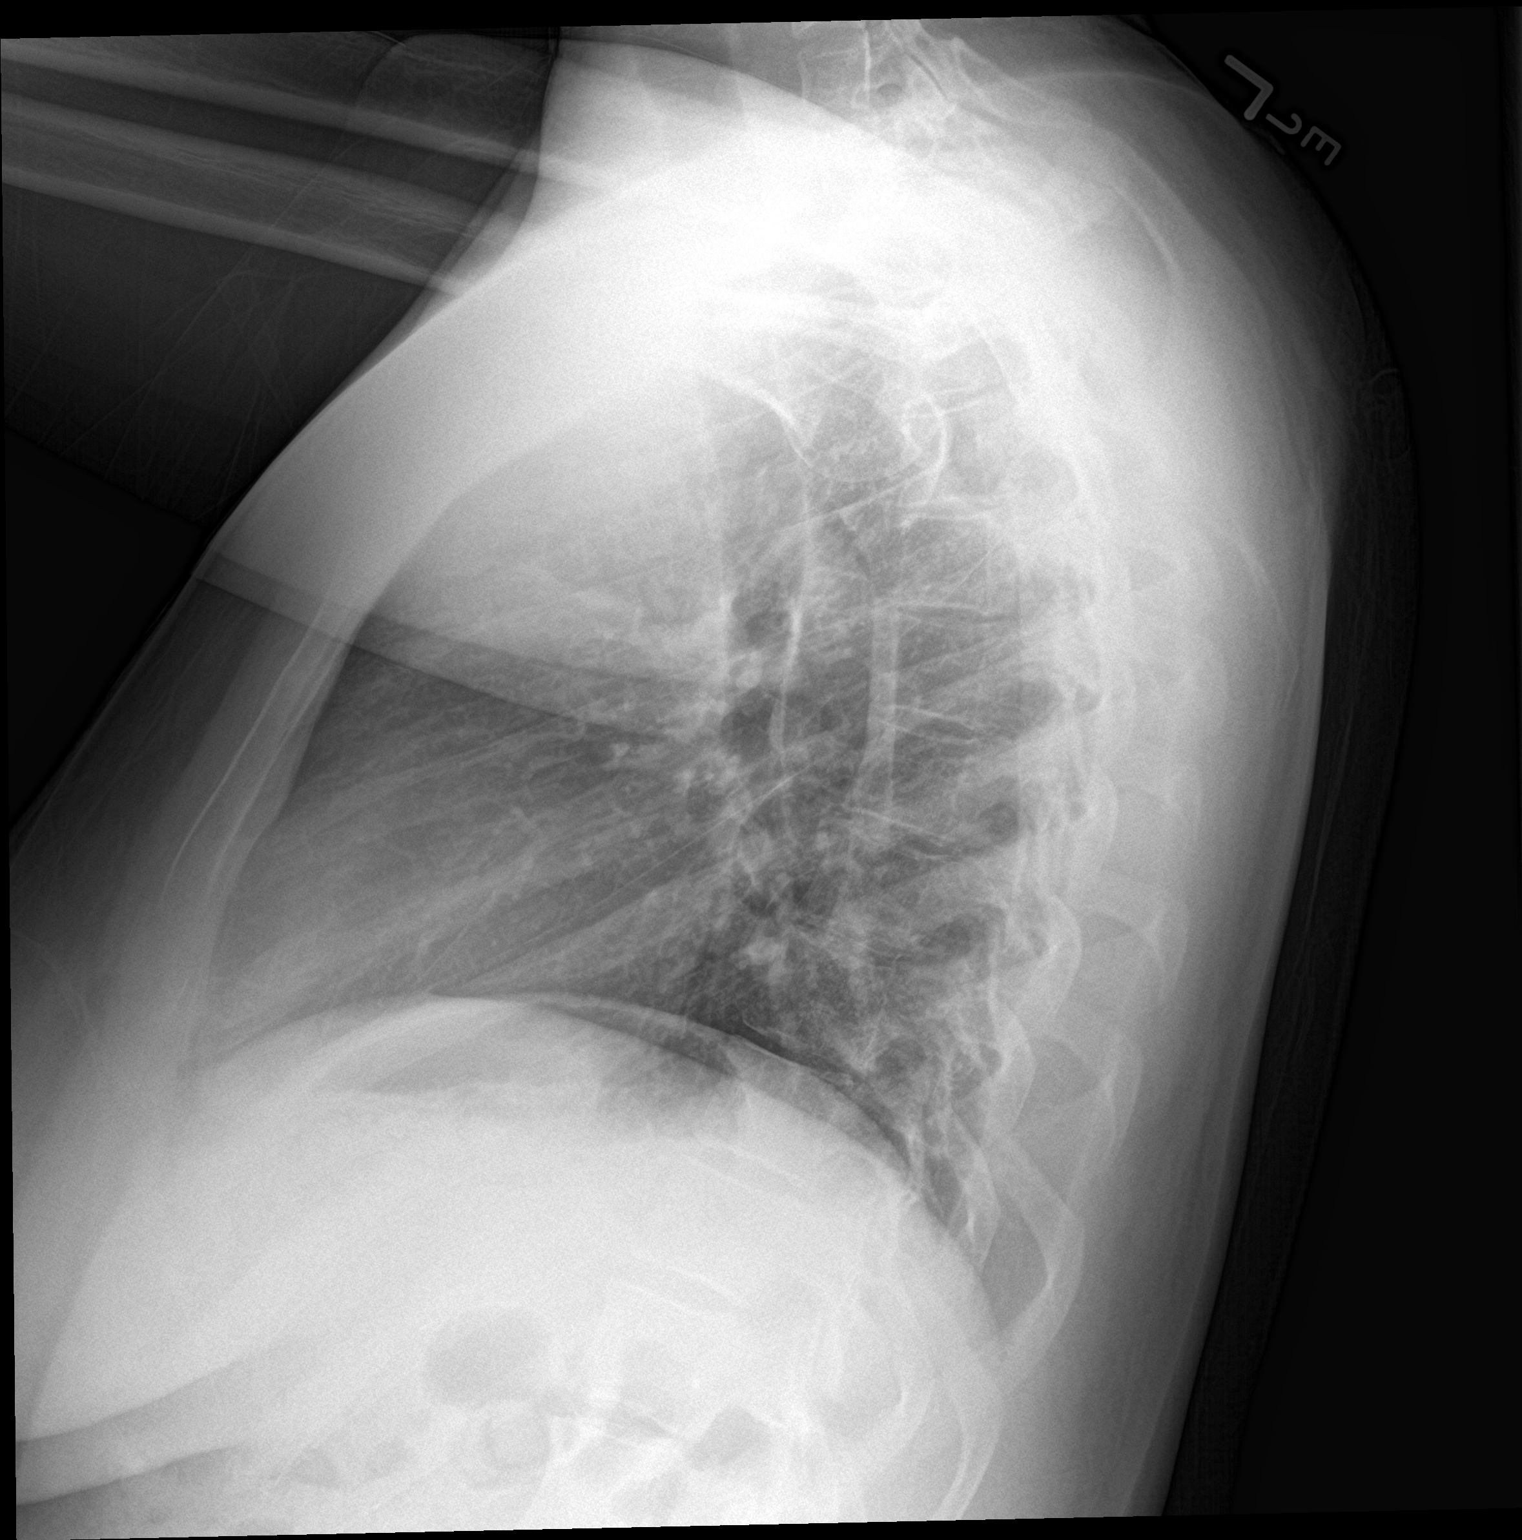

[2 of 2 positions shown; findings below may reference images not displayed]

FINDINGS: The cardiomediastinal contours are normal. The lungs are clear.
Pulmonary vasculature is normal. No consolidation, pleural effusion,
or pneumothorax. No acute osseous abnormalities are seen.
IMPRESSION: Negative radiographs of the chest.

## 2023-05-31 ENCOUNTER — Encounter (HOSPITAL_COMMUNITY): Payer: Self-pay | Admitting: *Deleted

## 2023-05-31 ENCOUNTER — Emergency Department (HOSPITAL_COMMUNITY)
Admission: EM | Admit: 2023-05-31 | Discharge: 2023-06-01 | Disposition: A | Discharge status: Home / Self Care | Payer: 59 | Attending: Emergency Medicine | Admitting: Emergency Medicine

## 2023-05-31 ENCOUNTER — Emergency Department (HOSPITAL_COMMUNITY): Payer: 59

## 2023-05-31 ENCOUNTER — Other Ambulatory Visit: Payer: Self-pay

## 2023-05-31 DIAGNOSIS — Z20822 Contact with and (suspected) exposure to covid-19: Secondary | ICD-10-CM | POA: Insufficient documentation

## 2023-05-31 DIAGNOSIS — J101 Influenza due to other identified influenza virus with other respiratory manifestations: Secondary | ICD-10-CM | POA: Diagnosis not present

## 2023-05-31 DIAGNOSIS — R519 Headache, unspecified: Secondary | ICD-10-CM

## 2023-05-31 LAB — CBC WITH DIFFERENTIAL/PLATELET
Abs Immature Granulocytes: 0 10*3/uL (ref 0.00–0.07)
Basophils Absolute: 0.1 10*3/uL (ref 0.0–0.1)
Basophils Relative: 3 %
Eosinophils Absolute: 0 10*3/uL (ref 0.0–0.5)
Eosinophils Relative: 0 %
HCT: 41.5 % (ref 36.0–46.0)
Hemoglobin: 14.5 g/dL (ref 12.0–15.0)
Lymphocytes Relative: 25 %
Lymphs Abs: 1.2 10*3/uL (ref 0.7–4.0)
MCH: 30.5 pg (ref 26.0–34.0)
MCHC: 34.9 g/dL (ref 30.0–36.0)
MCV: 87.2 fL (ref 80.0–100.0)
Monocytes Absolute: 0.7 10*3/uL (ref 0.1–1.0)
Monocytes Relative: 14 %
Neutro Abs: 2.7 10*3/uL (ref 1.7–7.7)
Neutrophils Relative %: 58 %
Platelets: 164 10*3/uL (ref 150–400)
RBC: 4.76 MIL/uL (ref 3.87–5.11)
RDW: 11.6 % (ref 11.5–15.5)
WBC: 4.7 10*3/uL (ref 4.0–10.5)
nRBC: 0 % (ref 0.0–0.2)
nRBC: 0 /100{WBCs}

## 2023-05-31 LAB — COMPREHENSIVE METABOLIC PANEL
ALT: 26 U/L (ref 0–44)
AST: 27 U/L (ref 15–41)
Albumin: 4.2 g/dL (ref 3.5–5.0)
Alkaline Phosphatase: 42 U/L (ref 38–126)
Anion gap: 12 (ref 5–15)
BUN: 11 mg/dL (ref 6–20)
CO2: 23 mmol/L (ref 22–32)
Calcium: 8.8 mg/dL — ABNORMAL LOW (ref 8.9–10.3)
Chloride: 102 mmol/L (ref 98–111)
Creatinine, Ser: 0.8 mg/dL (ref 0.44–1.00)
GFR, Estimated: >60 mL/min (ref >60)
Glucose, Bld: 114 mg/dL — ABNORMAL HIGH (ref 70–99)
Potassium: 3.8 mmol/L (ref 3.5–5.1)
Sodium: 137 mmol/L (ref 135–145)
Total Bilirubin: 1.3 mg/dL — ABNORMAL HIGH (ref 0.0–1.2)
Total Protein: 7.9 g/dL (ref 6.5–8.1)

## 2023-05-31 LAB — RESP PANEL BY RT-PCR (RSV, FLU A&B, COVID)  RVPGX2
Influenza A by PCR: POSITIVE — AB
Influenza B by PCR: NEGATIVE
Resp Syncytial Virus by PCR: NEGATIVE
SARS Coronavirus 2 by RT PCR: NEGATIVE

## 2023-05-31 LAB — HCG, SERUM, QUALITATIVE: Preg, Serum: NEGATIVE

## 2023-05-31 MED ORDER — SODIUM CHLORIDE 0.9 % IV BOLUS
1000.0000 mL | Freq: Once | INTRAVENOUS | Status: AC
  Administered 2023-06-01: 1000 mL via INTRAVENOUS

## 2023-05-31 MED ORDER — GADOBUTROL 1 MMOL/ML IV SOLN
7.0000 mL | Freq: Once | INTRAVENOUS | Status: AC | PRN
  Administered 2023-05-31: 7 mL via INTRAVENOUS

## 2023-05-31 MED ORDER — IPRATROPIUM-ALBUTEROL 0.5-2.5 (3) MG/3ML IN SOLN
3.0000 mL | Freq: Once | RESPIRATORY_TRACT | Status: AC
  Administered 2023-06-01: 3 mL via RESPIRATORY_TRACT
  Filled 2023-05-31: qty 3

## 2023-05-31 MED ORDER — KETOROLAC TROMETHAMINE 30 MG/ML IJ SOLN
30.0000 mg | Freq: Once | INTRAMUSCULAR | Status: AC
  Administered 2023-06-01: 30 mg via INTRAVENOUS
  Filled 2023-05-31: qty 1

## 2023-05-31 MED ORDER — PROCHLORPERAZINE EDISYLATE 10 MG/2ML IJ SOLN
10.0000 mg | Freq: Once | INTRAMUSCULAR | Status: AC
  Administered 2023-06-01: 10 mg via INTRAVENOUS
  Filled 2023-05-31: qty 2

## 2023-05-31 NOTE — ED Provider Notes (Signed)
[place] EMERGENCY DEPARTMENT AT [facility] Provider Note   CSN: [id number] Arrival date & time: 05/31/23  1756     History {Add pertinent medical, surgical, social history, OB history to HPI:1} Chief Complaint  Patient presents with   Headache    Christine West is a 33 y.o. female.  The history is provided by the patient.  Headache She comes in with flulike symptoms for the last 5 days.  She started with nasal congestion and has developed subjective fever and sweats, cough productive of white sputum, nausea, frontal headache.  There has been nausea but no vomiting.  She denies any diarrhea.  She does complain of some difficulty breathing.  Her husband had a similar illness and developed a viral meningitis.  She has been taking acetaminophen which does give her some temporary relief.   Home Medications Prior to Admission medications   Medication Sig Start Date End Date Taking? Authorizing Provider  acetaminophen (TYLENOL) 325 MG tablet Take 650 mg by mouth every 6 (six) hours as needed.    [provider]  doxycycline (VIBRAMYCIN) 100 MG capsule Take 1 capsule (100 mg total) by mouth 2 (two) times daily. 12/18/21   Pollyann Savoy, MD      Allergies    Asparagus, Other, and Codeine    Review of Systems   Review of Systems  Neurological:  Positive for headaches.  All other systems reviewed and are negative.   Physical Exam Updated Vital Signs BP (!) 134/95   Pulse 99   Temp 99.1 F (37.3 C)   Resp 18   Ht 5\' 2"  (1.575 m)   Wt 75.2 kg   LMP 05/23/2023   SpO2 98%   BMI 30.32 kg/m  Physical Exam Vitals and nursing note reviewed.   33 year old female, resting comfortably and in no acute distress. Vital signs are significant for borderline elevated blood pressure. Oxygen saturation is 98%, which is normal. Head is normocephalic and atraumatic. PERRLA, EOMI. Oropharynx is clear.  There is no sinus tenderness. Neck is nontender and supple  without adenopathy. Lungs are clear without rales, wheezes, or rhonchi.  There is a slightly prolonged exhalation phase. Chest is nontender. Heart has regular rate and rhythm without murmur. Abdomen is soft, flat, nontender. Extremities have no cyanosis or edema, full range of motion is present. Skin is warm and dry without rash. Neurologic: Mental status is normal, cranial nerves are intact, moves all extremities equally.  ED Results / Procedures / Treatments   Labs (all labs ordered are listed, but only abnormal results are displayed) Labs Reviewed  RESP PANEL BY RT-PCR (RSV, FLU A&B, COVID)  RVPGX2 - Abnormal; Notable for the following components:      Result Value   Influenza A by PCR POSITIVE (*)    All other components within normal limits  COMPREHENSIVE METABOLIC PANEL - Abnormal; Notable for the following components:   Glucose, Bld 114 (*)    Calcium 8.8 (*)    Total Bilirubin 1.3 (*)    All other components within normal limits  CULTURE, BLOOD (ROUTINE X 2)  CULTURE, BLOOD (ROUTINE X 2)  CBC WITH DIFFERENTIAL/PLATELET  HCG, SERUM, QUALITATIVE    EKG None  Radiology CT Head Wo Contrast Result Date: 05/31/2023 CLINICAL DATA:  Headache x4 days, husband admitted at [facility] with bacterial meningitis. CNS infection suspected. EXAM: CT HEAD WITHOUT CONTRAST TECHNIQUE: Contiguous axial images were obtained from the base of the skull through the vertex without  intravenous contrast. RADIATION DOSE REDUCTION: This exam was performed according to the departmental dose-optimization program which includes automated exposure control, adjustment of the mA and/or kV according to patient size and/or use of iterative reconstruction technique. COMPARISON:  None Available. FINDINGS: Brain: There is a slight asymmetric dense thickening up to 3 mm along the lateral aspect of the right leaf of the tentorium, best seen on series 5 images 47-49 and on sagittal reconstruction series 6 images 14-16.  Certainly in a trauma setting this would be suspicious for a minimal tentorial subdural bleed. In the current setting, infectious etiology including a small subdural empyema is more of a concern. The leptomeninges are not optimally seen with CT but they are not grossly thickened. There are dystrophic calcifications in the frontal falx. The brain parenchyma itself is unremarkable with normal gray-white matter attenuation and differentiation. The ventricles are normal in size and position. No infarct, hemorrhage or mass are seen. Basal cisterns are clear. Vascular: No hyperdense vessel or unexpected calcification. Skull: Normal. Negative for fracture or focal lesion. Sinuses/Orbits: Negative orbits. There is patchy membrane disease throughout the ethmoid air cells. There is moderate disease in the sphenoid sinus with mild fluid in the sphenoid sinus left-greater-than-right. Correlate clinically for acute sinusitis. All sinus walls are intact as far seen with the maxillary sinuses not fully included. Nasal septum is S shaped with left-sided spurring. There is no mastoid effusion. Both petrous apices are pneumatized. Other: None. IMPRESSION: 1. Slight asymmetric dense thickening up to 3 mm along the lateral aspect of the right leaf of the tentorium. In a trauma setting would be suspicious for a minimal tentorial subdural bleed. In the current setting, infectious etiology including a small subdural empyema is more of a concern. MRI without and with contrast recommended. 2. No other acute intracranial CT findings. 3. Sinus disease with fluid in the sphenoid sinus left-greater-than-right. Correlate clinically for acute sinusitis. 4. Critical Value/emergent results were called by telephone at the time of interpretation on 05/31/2023 at 8:37 pm to provider Baldo Ash , who verbally acknowledged these results. Electronically Signed   By: Almira Bar M.D.   On: 05/31/2023 20:46   DG Chest 2 View Result Date:  05/31/2023 CLINICAL DATA:  shortness of breath.  Left jaw pain EXAM: CHEST - 2 VIEW COMPARISON:  Chest x-ray 10/09/2021 FINDINGS: The heart and mediastinal contours are within normal limits. No focal consolidation. No pulmonary edema. No pleural effusion. No pneumothorax. No acute osseous abnormality. IMPRESSION: No active cardiopulmonary disease. Electronically Signed   By: Tish Frederickson M.D.   On: 05/31/2023 19:45    Procedures Procedures  {Document cardiac monitor, telemetry assessment procedure when appropriate:1}  Medications Ordered in ED Medications  gadobutrol (GADAVIST) 1 MMOL/ML injection 7 mL (7 mLs Intravenous Contrast Given 05/31/23 2154)    ED Course/ Medical Decision Making/ A&P Clinical Course as of 05/31/23 2301  Sat May 31, 2023  2041 Radiology has called saying that she should undergo MRI with contrast due to concerns for meningitis.  Orders were then placed for MRI with contrast. [CB]    Clinical Course User Index [CB] Lunette Stands, PA-C   {   Click here for ABCD2, HEART and other calculatorsREFRESH Note before signing :1}                              Medical Decision Making  Influenza-like illness.  Since her husband was diagnosed with influenza, this is  most likely influenza.  She had testing ordered at triage and I have reviewed laboratory tests.  My interpretation is normal CBC, mildly elevated random glucose which will need to be followed as an outpatient, borderline elevated bilirubin which is not felt to be clinically significant, negative pregnancy test, positive PCR for influenza A, negative PCR for influenza B, COVID-19, RSV.  Patient does have influenza A.  Chest x-ray shows no active cardiopulmonary disease, CT of head shows asymmetric thickening of the right leaf of the tentorium concerning for possible subdural empyema.  I have independently viewed these images, and agree with the radiologist's interpretation.  Clinically, patient does not have subdural  empyema.  However, per radiologist recommendation, MRI of the brain with and without contrast has been ordered and completed with radiologist interpretation pending.  I am giving the patient a therapeutic trial of albuterol and ipratropium via nebulizer and I am giving her a headache cocktail of normal saline, prochlorperazine, ketorolac.  {Document critical care time when appropriate:1} {Document review of labs and clinical decision tools ie heart score, Chads2Vasc2 etc:1}  {Document your independent review of radiology images, and any outside records:1} {Document your discussion with family members, caretakers, and with consultants:1} {Document social determinants of health affecting pt's care:1} {Document your decision making why or why not admission, treatments were needed:1} Final Clinical Impression(s) / ED Diagnoses Final diagnoses:  None    Rx / DC Orders ED Discharge Orders     None

## 2023-05-31 NOTE — ED Provider Triage Note (Signed)
Emergency Medicine Provider Triage Evaluation Note  Christine West , a 33 y.o. female  was evaluated in triage.  Pt complains of fevers, shortness of breath, headache, cervical muscle "soreness" x 5 days. Husband Dx w/ meningitis yesterday, influenza A positive. Describes headache as "pressure" throughout head accompanied by vertigo w/ nausea. Also endorses bilateral LE weakness. .  Says that 3 days ago she started to have visual hallucinations while attempting to fall asleep. Currently not having them.   Denies blurry vision, chest pain, v/d, dysuria.  Review of Systems  Positive: See above Negative: See above  Physical Exam  BP (!) 128/98 (BP Location: Right Arm)   Pulse (!) 112   Temp (!) 101.1 F (38.4 C)   Resp 20   Ht 5\' 2"  (1.575 m)   Wt 75.2 kg   LMP 05/23/2023   SpO2 95%   BMI 30.32 kg/m  Gen:   Awake, no distress   Resp:  Normal effort  MSK:   Moves extremities without difficulty  Other:    Medical Decision Making  Medically screening exam initiated at 6:31 PM.  Appropriate orders placed.  Christine West was informed that the remainder of the evaluation will be completed by another provider, this initial triage assessment does not replace that evaluation, and the importance of remaining in the ED until their evaluation is complete.     Lunette Stands, [place] 05/31/23 1843

## 2023-05-31 NOTE — ED Notes (Signed)
Pt going to room from MRI

## 2023-05-31 NOTE — ED Triage Notes (Signed)
The pt has had a headache since Tuesday  temp today her husband was admitted to the hospital yesterday in [place]  dx bacterial meningitis  lmp  jan 17th

## 2023-06-01 DIAGNOSIS — J101 Influenza due to other identified influenza virus with other respiratory manifestations: Secondary | ICD-10-CM | POA: Diagnosis not present

## 2023-06-01 MED ORDER — ONDANSETRON 4 MG PO TBDP: 4.0000 mg | ORAL_TABLET | Freq: Three times a day (TID) | ORAL | 0 refills | Status: AC | PRN

## 2023-06-01 MED ORDER — HYDROCODONE-ACETAMINOPHEN 5-325 MG PO TABS: 1.0000 | ORAL_TABLET | ORAL | 0 refills | Status: DC | PRN

## 2023-06-01 MED ORDER — DEXAMETHASONE SODIUM PHOSPHATE 10 MG/ML IJ SOLN
10.0000 mg | Freq: Once | INTRAMUSCULAR | Status: AC
  Administered 2023-06-01: 10 mg via INTRAVENOUS
  Filled 2023-06-01: qty 1

## 2023-06-01 NOTE — Discharge Instructions (Addendum)
Drink fluids.  Take ibuprofen and/or acetaminophen as needed for fever or aching.  You may use over-the-counter cough and cold medication.  If you need medication to suppress the cough, you may take hydrocodone-acetaminophen as often as every 6 hours.  Return if you have new or concerning symptoms.

## 2023-06-05 LAB — CULTURE, BLOOD (ROUTINE X 2)
Culture: NO GROWTH
Culture: NO GROWTH
Special Requests: ADEQUATE
Special Requests: ADEQUATE

## 2023-10-19 ENCOUNTER — Other Ambulatory Visit: Payer: Self-pay

## 2023-10-19 ENCOUNTER — Encounter (HOSPITAL_BASED_OUTPATIENT_CLINIC_OR_DEPARTMENT_OTHER): Payer: Self-pay

## 2023-10-19 ENCOUNTER — Emergency Department (HOSPITAL_BASED_OUTPATIENT_CLINIC_OR_DEPARTMENT_OTHER)
Admission: EM | Admit: 2023-10-19 | Discharge: 2023-10-19 | Disposition: A | Discharge status: Home / Self Care | Attending: Emergency Medicine | Admitting: Emergency Medicine

## 2023-10-19 DIAGNOSIS — L710 Perioral dermatitis: Secondary | ICD-10-CM | POA: Diagnosis not present

## 2023-10-19 DIAGNOSIS — Z91018 Allergy to other foods: Secondary | ICD-10-CM | POA: Diagnosis not present

## 2023-10-19 DIAGNOSIS — T7840XA Allergy, unspecified, initial encounter: Secondary | ICD-10-CM | POA: Insufficient documentation

## 2023-10-19 MED ORDER — EPINEPHRINE 0.3 MG/0.3ML IJ SOAJ: 0.3000 mg | INTRAMUSCULAR | 0 refills | Status: AC | PRN

## 2023-10-19 MED ORDER — PREDNISONE 20 MG PO TABS: 40.0000 mg | ORAL_TABLET | Freq: Every day | ORAL | 0 refills | Status: AC

## 2023-10-19 MED ORDER — PREDNISONE 20 MG PO TABS
40.0000 mg | ORAL_TABLET | Freq: Once | ORAL | Status: AC
  Administered 2023-10-19: 40 mg via ORAL
  Filled 2023-10-19: qty 2

## 2023-10-19 NOTE — Discharge Instructions (Signed)
 Please read and follow all provided instructions.  Your diagnoses today include:  1. Allergic reaction, initial encounter     Tests performed today include: Vital signs. See below for your results today.   Medications prescribed:  Epi-pen Inject into thigh as directed if you have a severe reaction that causes throat swelling or any trouble breathing. Call 9-1-1 immediately if you use an Epi-pen. You should be evaluated at a hospital as soon as possible.    Prednisone  - steroid medicine   It is best to take this medication in the morning to prevent sleeping problems. If you are diabetic, monitor your blood sugar closely and stop taking Prednisone  if blood sugar is over 300. Take with food to prevent stomach upset.   Take any prescribed medications only as directed.  Home care instructions:  Follow any educational materials contained in this packet  Follow-up instructions: Please follow-up with your primary care provider as needed for further evaluation of your symptoms.   Return instructions:  Please return to the Emergency Department if you experience worsening symptoms.  Call 9-1-1 immediately if you have an allergic reaction that involves your lips, mouth, throat or if you have any difficulty breathing. This is a life-threatening emergency.  Please return if you have any other emergent concerns.  Additional Information:  Your vital signs today were: BP (!) 140/94   Pulse 77   Temp 98.4 F (36.9 C) (Oral)   Resp 20   Ht 5' 2 (1.575 m)   Wt 77.1 kg   LMP 09/25/2023   SpO2 99%   BMI 31.09 kg/m  If your blood pressure (BP) was elevated above 135/85 this visit, please have this repeated by your doctor within one month. --------------

## 2023-10-19 NOTE — ED Triage Notes (Addendum)
 Pt reports eating pizza approx 30 mins ago eating pizza w hamburger, ham, and bacon. Tongue began to itch and feel like it was swelling, then chin. Hives around mouth. 2 benadyrl at home. Hurts with swallowing . Denies SOB or difficulty swallowing Reacted years ago to asparagus

## 2023-10-19 NOTE — ED Provider Notes (Signed)
 [place] EMERGENCY DEPARTMENT AT [facility] [place] Provider Note   CSN: [id number] Arrival date & time: 10/19/23  1509     Patient presents with: Allergic Reaction   Christine West is a 33 y.o. female.   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  She developed itching of her scalp.  She then developed urticaria circumorally.  No difficulty breathing or swallowing.  She took 50 mg of Benadryl.  Symptoms have gradually improved.  No associated vomiting or diarrhea.  No lightheadedness or syncope.  She denies hives on other areas of her body.  She does not have an EpiPen, her son has 1.       Prior to Admission medications   Medication Sig Start Date End Date Taking? Authorizing Provider  acetaminophen  (TYLENOL ) 325 MG tablet Take 650 mg by mouth every 6 (six) hours as needed.    [provider]  doxycycline  (VIBRAMYCIN ) 100 MG capsule Take 1 capsule (100 mg total) by mouth 2 (two) times daily. 12/18/21   Charmayne Cooper, MD  HYDROcodone -acetaminophen  (NORCO) 5-325 MG tablet Take 1 tablet by mouth every 4 (four) hours as needed for moderate pain (pain score 4-6) (or coughing). 06/01/23   Alissa April, MD  ondansetron  (ZOFRAN -ODT) 4 MG disintegrating tablet Take 1 tablet (4 mg total) by mouth every 8 (eight) hours as needed for nausea or vomiting. 06/01/23   Alissa April, MD    Allergies: Asparagus, Other, and Codeine    Review of Systems  Updated Vital Signs BP (!) 153/100 (BP Location: Right Arm)   Pulse 90   Temp 98.4 F (36.9 C) (Oral)   Resp 18   Wt 77.1 kg   LMP 09/25/2023   SpO2 99%   BMI 31.09 kg/m   Physical Exam Vitals and nursing note reviewed.  Constitutional:      Appearance: She is well-developed.  HENT:     Head: Normocephalic and atraumatic.     Mouth/Throat:     Comments: No angioedema or pharyngeal swelling.  Eyes:     Conjunctiva/sclera: Conjunctivae normal.    Pulmonary:     Effort: No respiratory distress.     Comments: No wheezing or respiratory distress  Musculoskeletal:     Cervical back: Normal range of motion and neck supple.   Skin:    General: Skin is warm and dry.     Comments: Minimal perioral urticaria, appears to be fading.   Neurological:     Mental Status: She is alert.     ED Course  Patient seen and examined. History obtained directly from patient.   Labs/EKG: None ordered  Imaging: None ordered  Medications/Fluids: Ordered: Prednisone  40 mg x 1  Most recent vital signs reviewed and are as follows: BP (!) 153/100 (BP Location: Right Arm)   Pulse 90   Temp 98.4 F (36.9 C) (Oral)   Resp 18   Wt 77.1 kg   LMP 09/25/2023   SpO2 99%   BMI 31.09 kg/m   Initial impression: Resolving allergic reaction.  Patient will likely be able to go after short observation period.  Will plan 3 additional days of prednisone , 3 additional days of antihistamines, EpiPen just in case.  4:30 PM Reassessment performed. Patient appears stable, comfortable.  No recurrence of symptoms.  Reviewed pertinent lab work and imaging with patient at bedside. Questions answered.   Most current vital signs reviewed and are as follows: BP Aaron Aas)  140/94   Pulse 77   Temp 98.4 F (36.9 C) (Oral)   Resp 20   Ht 5' 2 (1.575 m)   Wt 77.1 kg   LMP 09/25/2023   SpO2 99%   BMI 31.09 kg/m   Plan: Discharge to home.   Prescriptions written for: Prednisone , EpiPen  Other home care instructions discussed: Loratadine versus Benadryl for 3 days.  Patient states that she can get this over-the-counter.  ED return instructions discussed: Return with worsening symptoms  Follow-up instructions discussed: Patient encouraged to follow-up with their PCP as needed.    (all labs ordered are listed, but only abnormal results are displayed) Labs Reviewed - No data to display  EKG: None  Radiology: No results found.   Procedures    Medications Ordered in the ED  predniSONE  (DELTASONE ) tablet 40 mg (has no administration in time range)                                    Medical Decision Making Risk Prescription drug management.   Patient with allergic reaction, suspect due to food, cannot rule out mild anaphylaxis.  Symptoms resolving after antihistamines.  No respiratory distress.  No vital sign abnormalities or signs of shock.  Will continue additional steroids and antihistamines, EpiPen to have on hand.  Otherwise PCP follow-up.     Final diagnoses:  Allergic reaction, initial encounter    ED Discharge Orders          Ordered    predniSONE  (DELTASONE ) 20 MG tablet  Daily        10/19/23 1629    EPINEPHrine 0.3 mg/0.3 mL IJ SOAJ injection  As needed        10/19/23 1629               Alura Olveda, PA-C 10/19/23 1633    Dalene Duck, MD 10/19/23 [phone]

## 2023-11-30 ENCOUNTER — Encounter (HOSPITAL_BASED_OUTPATIENT_CLINIC_OR_DEPARTMENT_OTHER): Payer: Self-pay | Admitting: Emergency Medicine

## 2023-11-30 ENCOUNTER — Emergency Department (HOSPITAL_BASED_OUTPATIENT_CLINIC_OR_DEPARTMENT_OTHER)
Admission: EM | Admit: 2023-11-30 | Discharge: 2023-11-30 | Disposition: A | Discharge status: Home / Self Care | Attending: Emergency Medicine | Admitting: Emergency Medicine

## 2023-11-30 ENCOUNTER — Other Ambulatory Visit: Payer: Self-pay

## 2023-11-30 DIAGNOSIS — R21 Rash and other nonspecific skin eruption: Secondary | ICD-10-CM | POA: Diagnosis present

## 2023-11-30 MED ORDER — METHYLPREDNISOLONE SODIUM SUCC 125 MG IJ SOLR
125.0000 mg | Freq: Once | INTRAMUSCULAR | Status: AC
  Administered 2023-11-30: 125 mg via INTRAMUSCULAR
  Filled 2023-11-30: qty 2

## 2023-11-30 MED ORDER — HYDROXYZINE HCL 25 MG PO TABS
25.0000 mg | ORAL_TABLET | Freq: Once | ORAL | Status: AC
  Administered 2023-11-30: 25 mg via ORAL
  Filled 2023-11-30: qty 1

## 2023-11-30 MED ORDER — HYDROXYZINE HCL 25 MG PO TABS: 25.0000 mg | ORAL_TABLET | Freq: Three times a day (TID) | ORAL | 0 refills | Status: AC

## 2023-11-30 NOTE — ED Triage Notes (Signed)
 Pt reports itching for a few days. She reports fine rash to extremities. She states Benadryl helped temporarily but the itching comes back. Unknown cause. She reports no new products or foods.

## 2023-11-30 NOTE — ED Provider Notes (Signed)
 [place] [facility]ENC[facility] AT [facility] Provider Note   CSN: [id number] Arrival date & time: 11/30/23  0203     Patient presents with: Pruritis   Christine West is a 33 y.o. female.   33 yo F here with diffuse rash and associated pruritus. Has been going on since Wednesday at work. Benadryl helped when she took 50, 25 yesterday didn't help. Worsened tonight and couldn't sleep because of itching. Denies chest pain, sob, nausea, lightheadedness, diarrhea or fever.        Prior to Admission medications   Medication Sig Start Date End Date Taking? Authorizing Provider  hydrOXYzine  (ATARAX ) 25 MG tablet Take 1 tablet (25 mg total) by mouth 3 (three) times daily for 2 days. Then TID PRN for itchiness 11/30/23 12/02/23 Yes Alanea Woolridge, Selinda, MD  acetaminophen  (TYLENOL ) 325 MG tablet Take 650 mg by mouth every 6 (six) hours as needed.    [provider]  EPINEPHrine  0.3 mg/0.3 mL IJ SOAJ injection Inject 0.3 mg into the muscle as needed for anaphylaxis. 10/19/23   Desiderio Chew, PA-C  ondansetron  (ZOFRAN -ODT) 4 MG disintegrating tablet Take 1 tablet (4 mg total) by mouth every 8 (eight) hours as needed for nausea or vomiting. 06/01/23   Raford Lenis, MD    Allergies: Asparagus, Other, and Codeine    Review of Systems  Updated Vital Signs BP (!) 157/116 (BP Location: Right Arm)   Pulse 83   Temp 98.3 F (36.8 C)   Resp 20   Ht 5' 2 (1.575 m)   Wt 88.9 kg   LMP 11/24/2023   SpO2 95%   BMI 35.85 kg/m   Physical Exam Vitals and nursing note reviewed.  Constitutional:      Appearance: She is well-developed.  HENT:     Head: Normocephalic and atraumatic.  Cardiovascular:     Rate and Rhythm: Normal rate and regular rhythm.  Pulmonary:     Effort: No respiratory distress.     Breath sounds: No stridor.  Abdominal:     General: There is no distension.  Musculoskeletal:     Cervical back: Normal range of motion.  Skin:    General: Skin is warm and  dry.     Findings: Rash (sandpaper type rash to lower legs and lower arms.) present.  Neurological:     General: No focal deficit present.     Mental Status: She is alert.     (all labs ordered are listed, but only abnormal results are displayed) Labs Reviewed - No data to display  EKG: None  Radiology: No results found.   Procedures   Medications Ordered in the ED  methylPREDNISolone  sodium succinate (SOLU-MEDROL ) 125 mg/2 mL injection 125 mg (125 mg Intramuscular Given 11/30/23 0502)  hydrOXYzine  (ATARAX ) tablet 25 mg (25 mg Oral Given 11/30/23 0501)                                    Medical Decision Making Risk Prescription drug management.   Doubt infectious causes.  No h/o insect bites.  No h/o allergies or eczema.  It is on exposed portions of arms and legs only - heat rash? No fever, recent illness or other risk factors for scarlet fever.  Will treat symptomatically for now w/ pcp follow up if not improving as expected.      Final diagnoses:  Rash    ED Discharge Orders  Ordered    hydrOXYzine  (ATARAX ) 25 MG tablet  3 times daily        11/30/23 0453               Roshun Klingensmith, Selinda, MD 11/30/23 [phone]
